# Patient Record
Sex: Male | Born: 1980 | Race: White | Hispanic: No | Marital: Married | Smoking: Former smoker
Health system: Southern US, Community
[De-identification: ages and names within clinical notes are randomized; demographics above are authoritative.]

## PROBLEM LIST (undated history)

## (undated) DIAGNOSIS — R112 Nausea with vomiting, unspecified: Secondary | ICD-10-CM

## (undated) DIAGNOSIS — K429 Umbilical hernia without obstruction or gangrene: Secondary | ICD-10-CM

## (undated) DIAGNOSIS — Z8619 Personal history of other infectious and parasitic diseases: Secondary | ICD-10-CM

## (undated) DIAGNOSIS — F419 Anxiety disorder, unspecified: Secondary | ICD-10-CM

## (undated) DIAGNOSIS — T8859XA Other complications of anesthesia, initial encounter: Secondary | ICD-10-CM

## (undated) DIAGNOSIS — M1990 Unspecified osteoarthritis, unspecified site: Secondary | ICD-10-CM

## (undated) DIAGNOSIS — Z9889 Other specified postprocedural states: Secondary | ICD-10-CM

## (undated) DIAGNOSIS — M303 Mucocutaneous lymph node syndrome [Kawasaki]: Secondary | ICD-10-CM

## (undated) DIAGNOSIS — R55 Syncope and collapse: Secondary | ICD-10-CM

## (undated) HISTORY — DX: Anxiety disorder, unspecified: F41.9

## (undated) HISTORY — PX: CYST REMOVAL HAND: SHX6279

## (undated) HISTORY — DX: Mucocutaneous lymph node syndrome (kawasaki): M30.3

## (undated) HISTORY — PX: HERNIA REPAIR: SHX51

## (undated) HISTORY — DX: Syncope and collapse: R55

## (undated) HISTORY — DX: Personal history of other infectious and parasitic diseases: Z86.19

---
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
## 2006-04-07 ENCOUNTER — Emergency Department: Payer: Self-pay | Admitting: Emergency Medicine

## 2009-11-09 ENCOUNTER — Emergency Department: Payer: Self-pay | Admitting: Emergency Medicine

## 2010-09-12 ENCOUNTER — Emergency Department: Payer: Self-pay | Admitting: Unknown Physician Specialty

## 2011-03-15 ENCOUNTER — Emergency Department: Payer: Self-pay | Admitting: Emergency Medicine

## 2011-04-02 ENCOUNTER — Ambulatory Visit: Payer: Self-pay | Admitting: Anesthesiology

## 2011-05-08 ENCOUNTER — Ambulatory Visit: Payer: Self-pay | Admitting: Anesthesiology

## 2011-09-12 ENCOUNTER — Emergency Department: Payer: Self-pay | Admitting: Emergency Medicine

## 2011-09-24 ENCOUNTER — Ambulatory Visit: Payer: Self-pay | Admitting: Family Medicine

## 2011-11-15 ENCOUNTER — Observation Stay: Payer: Self-pay | Admitting: Surgery

## 2012-04-26 HISTORY — PX: OTHER SURGICAL HISTORY: SHX169

## 2014-01-22 ENCOUNTER — Emergency Department: Payer: Self-pay | Admitting: Emergency Medicine

## 2014-07-15 ENCOUNTER — Encounter: Payer: Self-pay | Admitting: General Surgery

## 2014-07-16 ENCOUNTER — Ambulatory Visit (INDEPENDENT_AMBULATORY_CARE_PROVIDER_SITE_OTHER): Payer: BLUE CROSS/BLUE SHIELD | Admitting: General Surgery

## 2014-07-16 ENCOUNTER — Encounter: Payer: Self-pay | Admitting: General Surgery

## 2014-07-16 VITALS — BP 118/80 | HR 70 | Resp 12 | Ht 69.0 in | Wt 140.0 lb

## 2014-07-16 DIAGNOSIS — R2230 Localized swelling, mass and lump, unspecified upper limb: Secondary | ICD-10-CM | POA: Insufficient documentation

## 2014-07-16 DIAGNOSIS — R2232 Localized swelling, mass and lump, left upper limb: Secondary | ICD-10-CM | POA: Diagnosis not present

## 2014-07-16 NOTE — Progress Notes (Signed)
Patient ID: Paul Contreras, male   DOB: 13-Aug-1980, 34 y.o.   MRN: [id number]  Chief Complaint  Patient presents with  . Other    right forearm mass    HPI Paul Contreras is a 34 y.o. male here today for a evaluation of a right forearm mass. He states it has been there for about a year. It does seem to have gotten larger. It started out about the size of a mosquito bite. Denies pain. The patient is accompanied today by his wife, Paul Contreras. They celebrates their eighth anniversary tomorrow.   HPI  Past Medical History  Diagnosis Date  . 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 Anxiety     Past Surgical History  Procedure Laterality Date  . Cyst removal hand Left     arm   . Wrist surgery Right   . Dog bite  2014    right arm    No family history on file.  Social History History  Substance Use Topics  . Smoking status: Former Smoker -- 1.00 packs/day for 10 years    Types: Cigarettes  . Smokeless tobacco: Never Used  . Alcohol Use: 0.0 oz/week    0 Standard drinks or equivalent per week    No Known Allergies  Current Outpatient Prescriptions  Medication Sig Dispense Refill  . clonazePAM (KLONOPIN) 1 MG tablet Take 1 mg by mouth 2 (two) times daily.  2   No current facility-administered medications for this visit.    Review of Systems Review of Systems  Constitutional: Negative.   Respiratory: Negative.   Cardiovascular: Negative.     Blood pressure 118/80, pulse 70, resp. rate 12, height 5\' 9"  (1.753 m), weight 140 lb (63.504 kg).  Physical Exam Physical Exam  Constitutional: He is oriented to person, place, and time. He appears well-developed and well-nourished.  Cardiovascular: Normal rate, regular rhythm and normal heart sounds.   Pulmonary/Chest: Effort normal and breath sounds normal.  Musculoskeletal:       Arms: Lymphadenopathy:    He has no cervical adenopathy.    He has no axillary adenopathy.  Neurological: He is alert and oriented to person,  place, and time.  Skin: Skin is warm and dry.       Data Reviewed PCP notes of 07/03/2014.  Assessment    Dermal cyst, possibly related to 2014 injury.    Plan    The area will benefit from excision. The procedure was reviewed with the patient and his wife who was present for the interview and exam. The area was prepped with alcohol and 10 mL of 0.5% Xylocaine with 0.25% Marcaine with 1-200,000 units of epinephrine was utilized well tolerated. ChloraPrep was applied to the skin. The area was excised through elliptical incision. Thick white fluid was noted within the lesion. The cyst wall was excised completely. Hemostasis was with a 3-0 Vicryl figure-of-eight suture. The adipose layer was approximated with a simple 3-0 Vicryl suture. The skin was closed with 4-0 nylon in a running fashion. Telfa and Tegaderm dressing applied. Ice pack provided.  Wound care reviewed.    Patient to return in one week nurse       PCP:  Audry RilesFisher, Donald E Katherina Wimer W 07/16/2014, 8:52 PM

## 2014-07-16 NOTE — Patient Instructions (Signed)
Patient to return in one week nurse  

## 2014-07-24 ENCOUNTER — Telehealth: Payer: Self-pay | Admitting: *Deleted

## 2014-07-24 ENCOUNTER — Ambulatory Visit: Payer: BLUE CROSS/BLUE SHIELD

## 2014-07-24 NOTE — Telephone Encounter (Signed)
Patient is aware of his path.

## 2014-07-24 NOTE — Telephone Encounter (Signed)
Called patient today because he missed his nurse check up for suture removal. Patient states he removed them today himself and the area is clean and no sign of infection. He stated he could not get off work.

## 2014-08-13 NOTE — Op Note (Signed)
PATIENT NAME:  Paul Contreras, Paul Contreras MR#:  [id number] DATE OF BIRTH:  17-Nov-1980  DATE OF PROCEDURE:  11/14/2011  PREOPERATIVE DIAGNOSIS: Dog bite right forearm.   POSTOPERATIVE DIAGNOSIS: Dog bite right forearm.   PROCEDURE: Wound exploration, irrigation and closure.    SURGEON: Quentin Orealph L. Ely III, MD  ANESTHESIA: General.    DESCRIPTION OF PROCEDURE: With the patient in supine position after induction of appropriate general anesthesia patient'Contreras right arm was prepped with ChloraPrep and draped with sterile towels. The area was copiously irrigated with warm saline solution with GU irrigant. There were several small bleeding sites, all of which appear to be superficial which were cauterized. Did not appear to be any significant tendon injury although there were some fascial deficits and tendon sheath deficits. These areas were all closed with 3-0 Vicryl. A Penrose drain was inserted to connect the two wounds as the lateral wound and the dorsal wound were clearly part of one injury. The drain was placed through both lumens and then the skin edges stapled. The drain was secured with 3-0 nylon. Several small lesions were also stapled. Did appear to be some ischemic change in the skin flap as the vascular supply appeared compromised from the proximal aspect. There was a small skin bridge which is responsible at the present time for the patient'Contreras blood flow. 30 mL of 0.25% Marcaine were used to provide postoperative analgesia avoiding the skin bridge. Sterile dressings were applied. The patient returned to the recovery room have tolerated procedure well. Sponge, instrument, and needle counts were correct x2 in the Operating Room.   ____________________________ Carmie Endalph L. Ely III, MD rle:cms D: 11/15/2011 00:13:00 ET T: 11/15/2011 08:12:51 ET JOB#: [id number] cc: Carmie Endalph L. Ely III, MD, <Dictator> Demetrios Isaacsonald E. Sherrie MustacheFisher, MD Quentin OreALPH L ELY MD ELECTRONICALLY SIGNED 11/16/2011 22:43

## 2014-08-13 NOTE — Discharge Summary (Signed)
PATIENT NAME:  Paul Contreras, Paul Contreras MR#:  [id number] DATE OF BIRTH:  06Effie Shy/01/1981  DATE OF ADMISSION:  11/15/2011 DATE OF DISCHARGE:  11/15/2011  BRIEF HISTORY: Mable ParisDaniel Mendolia is a 34 year old gentleman seen in the Emergency Room having suffered a dog bite to the right forearm. The injury was suffered approximately four hours prior to admission. X-ray was unremarkable with the exception of the obvious soft tissue loss. The patient was taken urgently to surgery where he underwent debridement, irrigation, and closure of the wound. There did not appear to be any evidence of significant muscle or tendon injury. Drain was placed. The wound was wrapped. He is discharged home today to be followed in the office in 2 to 3 days' time.   DISCHARGE MEDICATIONS: He is to continue his home medications of: 1. Klonopin 1 mg p.o. b.i.d.  2. Suboxone 8 mg/2 mg 2 tablets once a day.  3. Keflex 500 mg p.o. q.8 hours p.r.n. for 10 days.   FINAL DISCHARGE DIAGNOSIS: Soft tissue and skin injury right arm secondary to dog bite.   SURGERY: Wound exploration, irrigation, and closure.   ____________________________ Carmie Endalph L. Ely III, MD rle:drc D: 11/15/2011 06:44:22 ET T: 11/15/2011 14:37:50 ET JOB#: [id number]  cc: Quentin Orealph L. Ely III, MD, <Dictator> Quentin OreALPH L ELY MD ELECTRONICALLY SIGNED 11/16/2011 22:43

## 2014-08-13 NOTE — H&P (Signed)
PATIENT NAME:  Paul Contreras, Paul Contreras MR#:  [id number] DATE OF BIRTH:  08-30-1980  DATE OF ADMISSION:  11/15/2011  PRIMARY CARE PHYSICIAN: Dr. Mila Merry ADMITTING PHYSICIAN: Dr. Michela Pitcher.  CHIEF COMPLAINT: Dog bite right forearm.   BRIEF HISTORY: Goro Wenrick is a 34 year old gentleman seen in the Emergency Room with a significant injury to his right forearm. He suffered a dog bite to the volar surface of his arm approximately four hours prior to admission. He presented to the Emergency Room, bleeding was controlled with manual palpation and pressure. Wound exploration was tentatively carried out. There did not appear to be any significant vascular injury other than venous problems. He did not have any obvious neurovascular deficits in the hand. However, because of the extensive soft tissue damage the surgical service was consulted.   Patient denies any other serious medical problems. He does not have any history of cardiac disease, hypertension, or diabetes. Does have a history of substance abuse and is taking medication to place the methadone that he has been on in the past. He has a history of a fracture in the right forearm with a pin in his wrist. He has had previous cyst removed from his right chest and he has had bilateral inguinal hernias repaired as a baby.   CURRENT MEDICATIONS:  1. Klonopin 1 mg p.o. b.i.d.  2. Suboxone 8/2 sublingual 2 tabs twice a day. 3. Ultram 50 mg p.o. q.4 hours p.r.n.   ALLERGIES: He is not allergic to any medication.   SOCIAL HISTORY: He was a cigarette smoker until approximately 4 to 5 months ago. He does not drink any alcohol. He works as an Personnel officer.   FAMILY HISTORY: Noncontributory.   REVIEW OF SYSTEMS: Review of systems is not related to the current medical problem. He denies any other medical complaints at the present time other than his history of substance abuse.   PHYSICAL EXAMINATION:  GENERAL: He is an anxious, uncomfortable young gentleman in no  significant distress.   VITALS: Blood pressure 126/70, heart rate 88 and regular, respirations 22, pain scale 10, temperature 96.   HEENT: Unremarkable. He has no scleral icterus and no facial deformities.   NECK: Supple without adenopathy. His trachea is midline.   CHEST: Clear bilaterally with no adventitious sounds and has normal pulmonary excursion.   CARDIAC: No murmurs or gallops. He seems to be in normal sinus rhythm.    ABDOMEN: His abdomen is benign with no masses, hernias, guarding or rebound.   EXTREMITIES: Lower extremity exam is unremarkable with no deformities. Normal range of motion. Upper extremity exam reveals multiple puncture wounds and lacerations to the volar surface of the right forearm. He does appear to have some skin retraction, some obvious skin bleeding, exposed tendon. He has good distal pulses. No vascular insufficiency and no obvious neurovascular compromise. The arm was rewrapped after examination.   NEUROLOGIC: Exam reveals anxious affect with normal orientation.   IMPRESSION: This gentleman has significant forearm injury from a dog bite. Does not appear to be treatable in an Emergency Room local anesthetic setting. We will move him to the Operating Room and perform wound exploration and closure. The risks of infection and possible need for eventual skin graft have been discussed with the patient and his family. They are in agreement.    ____________________________ Carmie End, MD rle:cms D: 11/14/2011 22:38:20 ET T: 11/15/2011 07:12:08 ET JOB#: [id number] cc: Carmie End, MD, <Dictator> Demetrios Isaacs. Sherrie Mustache, MD Quentin Ore MD  ELECTRONICALLY SIGNED 11/16/2011 22:43

## 2014-09-29 ENCOUNTER — Emergency Department: Payer: BLUE CROSS/BLUE SHIELD

## 2014-09-29 ENCOUNTER — Emergency Department
Admission: EM | Admit: 2014-09-29 | Discharge: 2014-09-29 | Disposition: A | Discharge status: Home / Self Care | Payer: BLUE CROSS/BLUE SHIELD | Attending: Emergency Medicine | Admitting: Emergency Medicine

## 2014-09-29 ENCOUNTER — Encounter: Payer: Self-pay | Admitting: Emergency Medicine

## 2014-09-29 DIAGNOSIS — Z87891 Personal history of nicotine dependence: Secondary | ICD-10-CM | POA: Diagnosis not present

## 2014-09-29 DIAGNOSIS — Z792 Long term (current) use of antibiotics: Secondary | ICD-10-CM | POA: Insufficient documentation

## 2014-09-29 DIAGNOSIS — Y9389 Activity, other specified: Secondary | ICD-10-CM | POA: Diagnosis not present

## 2014-09-29 DIAGNOSIS — X58XXXA Exposure to other specified factors, initial encounter: Secondary | ICD-10-CM | POA: Insufficient documentation

## 2014-09-29 DIAGNOSIS — S6991XA Unspecified injury of right wrist, hand and finger(s), initial encounter: Secondary | ICD-10-CM | POA: Diagnosis present

## 2014-09-29 DIAGNOSIS — S61431A Puncture wound without foreign body of right hand, initial encounter: Secondary | ICD-10-CM | POA: Insufficient documentation

## 2014-09-29 DIAGNOSIS — L089 Local infection of the skin and subcutaneous tissue, unspecified: Secondary | ICD-10-CM | POA: Insufficient documentation

## 2014-09-29 DIAGNOSIS — T798XXA Other early complications of trauma, initial encounter: Secondary | ICD-10-CM

## 2014-09-29 DIAGNOSIS — Y92410 Unspecified street and highway as the place of occurrence of the external cause: Secondary | ICD-10-CM | POA: Insufficient documentation

## 2014-09-29 DIAGNOSIS — Y998 Other external cause status: Secondary | ICD-10-CM | POA: Diagnosis not present

## 2014-09-29 DIAGNOSIS — S60221A Contusion of right hand, initial encounter: Secondary | ICD-10-CM | POA: Diagnosis not present

## 2014-09-29 MED ORDER — CEPHALEXIN 500 MG PO CAPS: 500.0000 mg | ORAL_CAPSULE | Freq: Four times a day (QID) | ORAL | Status: AC

## 2014-09-29 MED ORDER — IBUPROFEN 800 MG PO TABS: 800.0000 mg | ORAL_TABLET | Freq: Three times a day (TID) | ORAL | Status: DC | PRN

## 2014-09-29 MED ORDER — HYDROCODONE-ACETAMINOPHEN 5-325 MG PO TABS: 1.0000 | ORAL_TABLET | ORAL | Status: DC | PRN
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
## 2014-09-29 NOTE — Discharge Instructions (Signed)
Hand Contusion A hand contusion is a deep bruise on your hand area. Contusions are the result of an injury that caused bleeding under the skin. The contusion may turn blue, purple, or yellow. Minor injuries will give you a painless contusion, but more severe contusions may stay painful and swollen for a few weeks. CAUSES  A contusion is usually caused by a blow, trauma, or direct force to an area of the body. SYMPTOMS   Swelling and redness of the injured area.  Discoloration of the injured area.  Tenderness and soreness of the injured area.  Pain. DIAGNOSIS  The diagnosis can be made by taking a history and performing a physical exam. An X-ray, CT scan, or MRI may be needed to determine if there were any associated injuries, such as broken bones (fractures). TREATMENT  Often, the best treatment for a hand contusion is resting, elevating, icing, and applying cold compresses to the injured area. Over-the-counter medicines may also be recommended for pain control. HOME CARE INSTRUCTIONS   Put ice on the injured area.  Put ice in a plastic bag.  Place a towel between your skin and the bag.  Leave the ice on for 15-20 minutes, 03-04 times a day.  Only take over-the-counter or prescription medicines as directed by your caregiver. Your caregiver may recommend avoiding anti-inflammatory medicines (aspirin, ibuprofen, and naproxen) for 48 hours because these medicines may increase bruising.  If told, use an elastic wrap as directed. This can help reduce swelling. You may remove the wrap for sleeping, showering, and bathing. If your fingers become numb, cold, or blue, take the wrap off and reapply it more loosely.  Elevate your hand with pillows to reduce swelling.  Avoid overusing your hand if it is painful. SEEK IMMEDIATE MEDICAL CARE IF:   You have increased redness, swelling, or pain in your hand.  Your swelling or pain is not relieved with medicines.  You have loss of feeling in  your hand or are unable to move your fingers.  Your hand turns cold or blue.  You have pain when you move your fingers.  Your hand becomes warm to the touch.  Your contusion does not improve in 2 days. MAKE SURE YOU:   Understand these instructions.  Will watch your condition.  Will get help right away if you are not doing well or get worse. Document Released: 10/02/2001 Document Revised: 01/05/2012 Document Reviewed: 10/04/2011 [facility] Patient Information 2015 [employer] [place]. This information is not intended to replace advice given to you by your health care provider. Make sure you discuss any questions you have with your health care provider.  Wound Infection A wound infection happens when a type of germ (bacteria) starts growing in the wound. In some cases, this can cause the wound to break open. If cared for properly, the infected wound will heal from the inside to the outside. Wound infections need treatment. CAUSES An infection is caused by bacteria growing in the wound.  SYMPTOMS   Increase in redness, swelling, or pain at the wound site.  Increase in drainage at the wound site.  Wound or bandage (dressing) starts to smell bad.  Fever.  Feeling tired or fatigued.  Pus draining from the wound. TREATMENT  Your health care provider will prescribe antibiotic medicine. The wound infection should improve within 24 to 48 hours. Any redness around the wound should stop spreading and the wound should be less painful.  HOME CARE INSTRUCTIONS   Only take over-the-counter or prescription medicines  for pain, discomfort, or fever as directed by your health care provider.  Take your antibiotics as directed. Finish them even if you start to feel better.  Gently wash the area with mild soap and water 2 times a day, or as directed. Rinse off the soap. Pat the area dry with a clean towel. Do not rub the wound. This may cause bleeding.  Follow your health care provider's  instructions for how often you need to change the dressing.  Apply ointment and a dressing to the wound as directed.  If the dressing sticks, moisten it with soapy water and gently remove it.  Change the bandage right away if it becomes wet, dirty, or develops a bad smell.  Take showers. Do not take tub baths, swim, or do anything that may soak the wound until it is healed.  Avoid exercises that make you sweat heavily.  Use anti-itch medicine as directed by your health care provider. The wound may itch when it is healing. Do not pick or scratch at the wound.  Follow up with your health care provider to get your wound rechecked as directed. SEEK MEDICAL CARE IF:  You have an increase in swelling, pain, or redness around the wound.  You have an increase in the amount of pus coming from the wound.  There is a bad smell coming from the wound.  More of the wound breaks open.  You have a fever. MAKE SURE YOU:   Understand these instructions.  Will watch your condition.  Will get help right away if you are not doing well or get worse. Document Released: 01/09/2003 Document Revised: 04/17/2013 Document Reviewed: 08/16/2010 [facility] Patient Information 2015 [place], [place]. This information is not intended to replace advice given to you by your health care provider. Make sure you discuss any questions you have with your health care provider.
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
## 2014-11-15 ENCOUNTER — Encounter: Payer: Self-pay | Admitting: Family Medicine

## 2014-11-15 ENCOUNTER — Ambulatory Visit (INDEPENDENT_AMBULATORY_CARE_PROVIDER_SITE_OTHER): Payer: BLUE CROSS/BLUE SHIELD | Admitting: Family Medicine

## 2014-11-15 VITALS — BP 116/74 | HR 86 | Temp 97.6°F | Resp 16 | Wt 138.0 lb

## 2014-11-15 DIAGNOSIS — R55 Syncope and collapse: Secondary | ICD-10-CM

## 2014-11-15 DIAGNOSIS — L509 Urticaria, unspecified: Secondary | ICD-10-CM

## 2014-11-15 DIAGNOSIS — M7021 Olecranon bursitis, right elbow: Secondary | ICD-10-CM | POA: Insufficient documentation

## 2014-11-15 DIAGNOSIS — S62509A Fracture of unspecified phalanx of unspecified thumb, initial encounter for closed fracture: Secondary | ICD-10-CM | POA: Insufficient documentation

## 2014-11-15 DIAGNOSIS — R2231 Localized swelling, mass and lump, right upper limb: Secondary | ICD-10-CM | POA: Insufficient documentation

## 2014-11-15 DIAGNOSIS — M303 Mucocutaneous lymph node syndrome [Kawasaki]: Secondary | ICD-10-CM | POA: Insufficient documentation

## 2014-11-15 HISTORY — DX: Syncope and collapse: R55

## 2014-11-15 MED ORDER — RANITIDINE HCL 150 MG PO TABS: 150.0000 mg | ORAL_TABLET | Freq: Two times a day (BID) | ORAL | Status: DC

## 2014-11-15 NOTE — Progress Notes (Signed)
Patient: Paul Contreras Male    DOB: 1980/09/19   34 y.o.   MRN: [id number] Visit Date: 11/15/2014  Today's Provider: Mila Merry, MD   Chief Complaint  Patient presents with  . Rash   Subjective:    Rash This is a recurrent problem. The current episode started more than 1 year ago. The problem is unchanged. The rash is characterized by itchiness, redness and swelling. Associated symptoms include shortness of breath. Past treatments include topical steroids. The treatment provided no relief.     Rash appears at random, has happened on several different occurences in the last year. Rash usually lasts hours to a day or two. Had been occuring every week or two, but over the last month has been having at increasing frequencies, sometimes several days in a row. Is very itchy, usually clears up with benadryl, although episode did not respond well to benadryl. Has mostly resolved today, but has some scratches on feet. Does not seem related to heat or environment. No specific triggers. No new medications. No new foods. No new pets.   He brings pictures on his cell phone showing extensive raised erythema on his back, arms and legs.     No Known Allergies Previous Medications   CLONAZEPAM (KLONOPIN) 1 MG TABLET    Take 1 mg by mouth 2 (two) times daily.   HYDROCODONE-ACETAMINOPHEN (NORCO) 5-325 MG PER TABLET    Take 1-2 tablets by mouth every 4 (four) hours as needed for moderate pain.   IBUPROFEN (ADVIL,MOTRIN) 800 MG TABLET    Take 1 tablet (800 mg total) by mouth every 8 (eight) hours as needed.    Review of Systems  Respiratory: Positive for shortness of breath.   Cardiovascular: Negative for chest pain and palpitations.  Musculoskeletal: Positive for back pain.  Skin: Positive for rash.  Neurological: Positive for dizziness and light-headedness. Negative for headaches.    History  Substance Use Topics  . Smoking status: Former Smoker -- 1.00 packs/day for 15 years   Types: Cigarettes  . Smokeless tobacco: Never Used  . Alcohol Use: 0.0 oz/week    0 Standard drinks or equivalent per week     Comment: occasional use; heavy drinker in the past. Now drinks socially. 2 drinks every other week   Objective:   BP 116/74 mmHg  Pulse 86  Temp(Src) 97.6 F (36.4 C) (Oral)  Resp 16  Wt 138 lb (62.596 kg)  SpO2 96%  Physical Exam  General appearance: alert, well developed, well nourished, cooperative and in no distress Head: Normocephalic, without obvious abnormality, atraumatic Lungs: Respirations even and unlabored Extremities: No gross deformities Skin: Skin color, texture, turgor normal. No rashes seen  Psych: Appropriate mood and affect. Neurologic: Mental status: Alert, oriented to person, place, and time, thought content appropriate.     Assessment & Plan:      1. Urticaria Ongoing and increasing in frequency over the last year. May be psychiatric or anxiety related. Possible allergic. He and his girlfriend are very frustrated by this and would like more definitive evaluation. Will start with H1 & H2 blockers and referral to allergist. Advised he will need to be off of antihistamines if skin testing is done.   - Ambulatory referral to Allergy - ranitidine (ZANTAC) 150 MG tablet; Take 1 tablet (150 mg total) by mouth 2 (two) times daily.  Dispense: 60 tablet; Refill: 1        Mila Merry, MD  [facility]  [place]

## 2014-11-15 NOTE — Patient Instructions (Signed)
Start OTC Zyrtec (cetirizine)  once a day.

## 2016-04-30 ENCOUNTER — Encounter: Payer: Self-pay | Admitting: Emergency Medicine

## 2016-04-30 ENCOUNTER — Emergency Department
Admission: EM | Admit: 2016-04-30 | Discharge: 2016-04-30 | Disposition: A | Discharge status: Home / Self Care | Payer: No Typology Code available for payment source | Attending: Emergency Medicine | Admitting: Emergency Medicine

## 2016-04-30 ENCOUNTER — Emergency Department: Payer: No Typology Code available for payment source

## 2016-04-30 DIAGNOSIS — Y999 Unspecified external cause status: Secondary | ICD-10-CM | POA: Insufficient documentation

## 2016-04-30 DIAGNOSIS — Z87891 Personal history of nicotine dependence: Secondary | ICD-10-CM | POA: Diagnosis not present

## 2016-04-30 DIAGNOSIS — Y9389 Activity, other specified: Secondary | ICD-10-CM | POA: Insufficient documentation

## 2016-04-30 DIAGNOSIS — S0990XA Unspecified injury of head, initial encounter: Secondary | ICD-10-CM | POA: Diagnosis present

## 2016-04-30 DIAGNOSIS — S6392XA Sprain of unspecified part of left wrist and hand, initial encounter: Secondary | ICD-10-CM | POA: Insufficient documentation

## 2016-04-30 DIAGNOSIS — S161XXA Strain of muscle, fascia and tendon at neck level, initial encounter: Secondary | ICD-10-CM

## 2016-04-30 DIAGNOSIS — S0003XA Contusion of scalp, initial encounter: Secondary | ICD-10-CM | POA: Diagnosis not present

## 2016-04-30 DIAGNOSIS — S63502A Unspecified sprain of left wrist, initial encounter: Secondary | ICD-10-CM

## 2016-04-30 DIAGNOSIS — Y92828 Other wilderness area as the place of occurrence of the external cause: Secondary | ICD-10-CM | POA: Insufficient documentation

## 2016-04-30 MED ORDER — HYDROCODONE-ACETAMINOPHEN 5-325 MG PO TABS
2.0000 | ORAL_TABLET | Freq: Once | ORAL | Status: AC
  Administered 2016-04-30: 2 via ORAL
  Filled 2016-04-30: qty 2

## 2016-04-30 MED ORDER — HYDROCODONE-ACETAMINOPHEN 5-325 MG PO TABS: 1.0000 | ORAL_TABLET | ORAL | 0 refills | Status: DC | PRN

## 2016-04-30 NOTE — Discharge Instructions (Signed)
Wear wrist  splint to protect your left wrist. Ice and elevate to reduce swelling and pain. Norco as needed for stool pain. Follow-up with your primary care doctor if any continued problems. Clean abrasion with mild soap and water daily. Watch for signs of infection.
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
## 2016-07-29 ENCOUNTER — Emergency Department
Admission: EM | Admit: 2016-07-29 | Discharge: 2016-07-29 | Disposition: A | Discharge status: Home / Self Care | Payer: Self-pay | Attending: Emergency Medicine | Admitting: Emergency Medicine

## 2016-07-29 ENCOUNTER — Emergency Department: Payer: Self-pay

## 2016-07-29 ENCOUNTER — Encounter: Payer: Self-pay | Admitting: Emergency Medicine

## 2016-07-29 DIAGNOSIS — Y999 Unspecified external cause status: Secondary | ICD-10-CM | POA: Insufficient documentation

## 2016-07-29 DIAGNOSIS — Y9389 Activity, other specified: Secondary | ICD-10-CM | POA: Insufficient documentation

## 2016-07-29 DIAGNOSIS — S80811A Abrasion, right lower leg, initial encounter: Secondary | ICD-10-CM

## 2016-07-29 DIAGNOSIS — Y929 Unspecified place or not applicable: Secondary | ICD-10-CM | POA: Insufficient documentation

## 2016-07-29 DIAGNOSIS — S8781XA Crushing injury of right lower leg, initial encounter: Secondary | ICD-10-CM | POA: Insufficient documentation

## 2016-07-29 DIAGNOSIS — W228XXA Striking against or struck by other objects, initial encounter: Secondary | ICD-10-CM | POA: Insufficient documentation

## 2016-07-29 DIAGNOSIS — Z87891 Personal history of nicotine dependence: Secondary | ICD-10-CM | POA: Insufficient documentation

## 2016-07-29 MED ORDER — OXYCODONE-ACETAMINOPHEN 5-325 MG PO TABS: 1.0000 | ORAL_TABLET | ORAL | 0 refills | Status: DC | PRN

## 2016-07-29 MED ORDER — CEPHALEXIN 500 MG PO CAPS: 500.0000 mg | ORAL_CAPSULE | Freq: Three times a day (TID) | ORAL | 0 refills | Status: DC

## 2016-07-29 MED ORDER — IBUPROFEN 600 MG PO TABS: 600.0000 mg | ORAL_TABLET | Freq: Three times a day (TID) | ORAL | 0 refills | Status: DC | PRN

## 2016-07-29 MED ORDER — OXYCODONE-ACETAMINOPHEN 5-325 MG PO TABS
2.0000 | ORAL_TABLET | Freq: Once | ORAL | Status: AC
  Administered 2016-07-29: 2 via ORAL
  Filled 2016-07-29: qty 2

## 2016-07-29 NOTE — Discharge Instructions (Signed)
Ice and elevate to reduce swelling and help with pain. Use crutches for walking. Call and make an appointment with your primary care doctor for follow-up. Ibuprofen 600 mg 3 times a day with food. Percocet one every 4 hours as needed for pain. Keflex 500 mg 3 times a day for 7 days. Wash right leg with mild soap and water twice a day. Watch for signs of infection.   Return to the emergency room if any signs of infection, drainage, fever, chills or severe worsening of your symptoms.
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
## 2019-10-07 ENCOUNTER — Other Ambulatory Visit: Payer: Self-pay

## 2019-10-07 ENCOUNTER — Emergency Department: Payer: Self-pay

## 2019-10-07 ENCOUNTER — Emergency Department
Admission: EM | Admit: 2019-10-07 | Discharge: 2019-10-07 | Disposition: A | Discharge status: Home / Self Care | Payer: Self-pay | Attending: Emergency Medicine | Admitting: Emergency Medicine

## 2019-10-07 ENCOUNTER — Encounter: Payer: Self-pay | Admitting: Intensive Care

## 2019-10-07 DIAGNOSIS — Y999 Unspecified external cause status: Secondary | ICD-10-CM | POA: Insufficient documentation

## 2019-10-07 DIAGNOSIS — S62639B Displaced fracture of distal phalanx of unspecified finger, initial encounter for open fracture: Secondary | ICD-10-CM

## 2019-10-07 DIAGNOSIS — S62631B Displaced fracture of distal phalanx of left index finger, initial encounter for open fracture: Secondary | ICD-10-CM | POA: Insufficient documentation

## 2019-10-07 DIAGNOSIS — Z23 Encounter for immunization: Secondary | ICD-10-CM | POA: Insufficient documentation

## 2019-10-07 DIAGNOSIS — Y9289 Other specified places as the place of occurrence of the external cause: Secondary | ICD-10-CM | POA: Insufficient documentation

## 2019-10-07 DIAGNOSIS — Z87891 Personal history of nicotine dependence: Secondary | ICD-10-CM | POA: Insufficient documentation

## 2019-10-07 DIAGNOSIS — Z20822 Contact with and (suspected) exposure to covid-19: Secondary | ICD-10-CM | POA: Insufficient documentation

## 2019-10-07 DIAGNOSIS — R600 Localized edema: Secondary | ICD-10-CM | POA: Insufficient documentation

## 2019-10-07 DIAGNOSIS — Y9389 Activity, other specified: Secondary | ICD-10-CM | POA: Insufficient documentation

## 2019-10-07 DIAGNOSIS — W3400XA Accidental discharge from unspecified firearms or gun, initial encounter: Secondary | ICD-10-CM | POA: Insufficient documentation

## 2019-10-07 LAB — CBC WITH DIFFERENTIAL/PLATELET
Abs Immature Granulocytes: 0.04 10*3/uL (ref 0.00–0.07)
Basophils Absolute: 0.1 10*3/uL (ref 0.0–0.1)
Basophils Relative: 1 %
Eosinophils Absolute: 0 10*3/uL (ref 0.0–0.5)
Eosinophils Relative: 0 %
HCT: 40.8 % (ref 39.0–52.0)
Hemoglobin: 13.9 g/dL (ref 13.0–17.0)
Immature Granulocytes: 1 %
Lymphocytes Relative: 10 %
Lymphs Abs: 0.8 10*3/uL (ref 0.7–4.0)
MCH: 31.2 pg (ref 26.0–34.0)
MCHC: 34.1 g/dL (ref 30.0–36.0)
MCV: 91.5 fL (ref 80.0–100.0)
Monocytes Absolute: 0.6 10*3/uL (ref 0.1–1.0)
Monocytes Relative: 8 %
Neutro Abs: 6.1 10*3/uL (ref 1.7–7.7)
Neutrophils Relative %: 80 %
Platelets: 305 10*3/uL (ref 150–400)
RBC: 4.46 MIL/uL (ref 4.22–5.81)
RDW: 11.9 % (ref 11.5–15.5)
WBC: 7.6 10*3/uL (ref 4.0–10.5)
nRBC: 0 % (ref 0.0–0.2)

## 2019-10-07 LAB — COMPREHENSIVE METABOLIC PANEL
ALT: 17 U/L (ref 0–44)
AST: 25 U/L (ref 15–41)
Albumin: 4.4 g/dL (ref 3.5–5.0)
Alkaline Phosphatase: 47 U/L (ref 38–126)
Anion gap: 9 (ref 5–15)
BUN: 13 mg/dL (ref 6–20)
CO2: 26 mmol/L (ref 22–32)
Calcium: 8.8 mg/dL — ABNORMAL LOW (ref 8.9–10.3)
Chloride: 102 mmol/L (ref 98–111)
Creatinine, Ser: 0.7 mg/dL (ref 0.61–1.24)
GFR calc Af Amer: >60 mL/min (ref >60)
GFR calc non Af Amer: >60 mL/min (ref >60)
Glucose, Bld: 95 mg/dL (ref 70–99)
Potassium: 3.8 mmol/L (ref 3.5–5.1)
Sodium: 137 mmol/L (ref 135–145)
Total Bilirubin: 0.7 mg/dL (ref 0.3–1.2)
Total Protein: 7.2 g/dL (ref 6.5–8.1)

## 2019-10-07 LAB — SARS CORONAVIRUS 2 BY RT PCR (HOSPITAL ORDER, PERFORMED IN ~~LOC~~ HOSPITAL LAB): SARS Coronavirus 2: NEGATIVE

## 2019-10-07 LAB — APTT: aPTT: 26 seconds (ref 24–36)

## 2019-10-07 LAB — PROTIME-INR
INR: 0.9 (ref 0.8–1.2)
Prothrombin Time: 11.3 seconds — ABNORMAL LOW (ref 11.4–15.2)

## 2019-10-07 MED ORDER — OXYCODONE HCL 5 MG PO TABS: 5.0000 mg | ORAL_TABLET | Freq: Four times a day (QID) | ORAL | 0 refills | Status: AC | PRN

## 2019-10-07 MED ORDER — TETANUS-DIPHTH-ACELL PERTUSSIS 5-2.5-18.5 LF-MCG/0.5 IM SUSP
0.5000 mL | Freq: Once | INTRAMUSCULAR | Status: AC
  Administered 2019-10-07: 0.5 mL via INTRAMUSCULAR
  Filled 2019-10-07: qty 0.5

## 2019-10-07 MED ORDER — CEPHALEXIN 250 MG PO CAPS: 250.0000 mg | ORAL_CAPSULE | Freq: Four times a day (QID) | ORAL | 0 refills | Status: AC

## 2019-10-07 MED ORDER — OXYCODONE HCL 5 MG PO TABS
5.0000 mg | ORAL_TABLET | Freq: Once | ORAL | Status: AC
  Administered 2019-10-07: 5 mg via ORAL
  Filled 2019-10-07: qty 1

## 2019-10-07 MED ORDER — CEFAZOLIN SODIUM-DEXTROSE 1-4 GM/50ML-% IV SOLN
1.0000 g | Freq: Once | INTRAVENOUS | Status: AC
  Administered 2019-10-07: 1 g via INTRAVENOUS
  Filled 2019-10-07 (×2): qty 50

## 2019-10-07 MED ORDER — HYDROMORPHONE HCL 1 MG/ML IJ SOLN
0.5000 mg | Freq: Once | INTRAMUSCULAR | Status: AC
  Administered 2019-10-07: 0.5 mg via INTRAVENOUS
  Filled 2019-10-07: qty 1

## 2019-10-07 MED ORDER — ONDANSETRON HCL 4 MG/2ML IJ SOLN
4.0000 mg | Freq: Once | INTRAMUSCULAR | Status: AC
  Administered 2019-10-07: 4 mg via INTRAVENOUS
  Filled 2019-10-07: qty 2
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
## 2019-11-25 ENCOUNTER — Encounter: Payer: Self-pay | Admitting: Emergency Medicine

## 2019-11-25 ENCOUNTER — Other Ambulatory Visit: Payer: Self-pay

## 2019-11-25 ENCOUNTER — Emergency Department: Payer: Medicaid Other

## 2019-11-25 DIAGNOSIS — S91311A Laceration without foreign body, right foot, initial encounter: Secondary | ICD-10-CM | POA: Insufficient documentation

## 2019-11-25 DIAGNOSIS — W5652XA Struck by other fish, initial encounter: Secondary | ICD-10-CM | POA: Insufficient documentation

## 2019-11-25 DIAGNOSIS — Y9319 Activity, other involving water and watercraft: Secondary | ICD-10-CM | POA: Insufficient documentation

## 2019-11-25 DIAGNOSIS — Y999 Unspecified external cause status: Secondary | ICD-10-CM | POA: Insufficient documentation

## 2019-11-25 DIAGNOSIS — Z87891 Personal history of nicotine dependence: Secondary | ICD-10-CM | POA: Insufficient documentation

## 2019-11-25 DIAGNOSIS — Y92832 Beach as the place of occurrence of the external cause: Secondary | ICD-10-CM | POA: Insufficient documentation

## 2019-11-25 NOTE — ED Triage Notes (Signed)
Pt arrived [place] POV [place], [place] to [place] of foot.  Unk last tetanus

## 2019-11-26 ENCOUNTER — Emergency Department
Admission: EM | Admit: 2019-11-26 | Discharge: 2019-11-26 | Disposition: A | Discharge status: Home / Self Care | Payer: Medicaid Other | Attending: Emergency Medicine | Admitting: Emergency Medicine

## 2019-11-26 DIAGNOSIS — S91311A Laceration without foreign body, right foot, initial encounter: Secondary | ICD-10-CM

## 2019-11-26 MED ORDER — IBUPROFEN 800 MG PO TABS
800.0000 mg | ORAL_TABLET | ORAL | Status: AC
  Administered 2019-11-26: 800 mg via ORAL
  Filled 2019-11-26: qty 1

## 2019-11-26 MED ORDER — PENTAFLUOROPROP-TETRAFLUOROETH EX AERO
INHALATION_SPRAY | CUTANEOUS | Status: DC | PRN
  Administered 2019-11-26: 30 via TOPICAL
  Filled 2019-11-26 (×2): qty 30

## 2019-11-26 MED ORDER — HYDROCODONE-ACETAMINOPHEN 5-325 MG PO TABS
2.0000 | ORAL_TABLET | Freq: Once | ORAL | Status: AC
  Administered 2019-11-26: 2 via ORAL
  Filled 2019-11-26: qty 2

## 2019-11-26 MED ORDER — DOXYCYCLINE HYCLATE 100 MG PO CAPS: 100.0000 mg | ORAL_CAPSULE | Freq: Two times a day (BID) | ORAL | 0 refills | Status: DC

## 2019-11-26 MED ORDER — CIPROFLOXACIN HCL 500 MG PO TABS: 500.0000 mg | ORAL_TABLET | Freq: Two times a day (BID) | ORAL | 0 refills | Status: AC

## 2019-11-26 MED ORDER — DOXYCYCLINE HYCLATE 100 MG PO TABS
100.0000 mg | ORAL_TABLET | Freq: Once | ORAL | Status: AC
  Administered 2019-11-26: 100 mg via ORAL
  Filled 2019-11-26: qty 1

## 2019-11-26 MED ORDER — CIPROFLOXACIN HCL 500 MG PO TABS
500.0000 mg | ORAL_TABLET | Freq: Once | ORAL | Status: AC
  Administered 2019-11-26: 500 mg via ORAL
  Filled 2019-11-26: qty 1
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
## 2019-11-26 NOTE — Discharge Instructions (Addendum)
Please watch carefully for signs of infection.  Please return to the ER right away if you develop a fever, drainage of yellow or pus from the laceration, fever, numbness weakness or cold blue foot, bleeding, redness, or other new concerns or symptoms arise.  No driving this morning. Use crutches and keep weight off your right foot until you are able to follow-up with Dr. Alberteen Spindle this week for reassessment.  Please call today to set up a follow-up appointment this week with podiatrist Dr. Alberteen Spindle.  Use antibiotics as prescribed to prevent infection.

## 2020-07-14 ENCOUNTER — Ambulatory Visit (INDEPENDENT_AMBULATORY_CARE_PROVIDER_SITE_OTHER): Payer: BLUE CROSS/BLUE SHIELD | Admitting: Family Medicine

## 2020-07-14 DIAGNOSIS — Z5329 Procedure and treatment not carried out because of patient's decision for other reasons: Secondary | ICD-10-CM

## 2020-07-14 NOTE — Patient Instructions (Signed)
.   Please review the attached list of medications and notify my office if there are any errors.   . Please bring all of your medications to every appointment so we can make sure that our medication list is the same as yours.   

## 2020-07-14 NOTE — Progress Notes (Signed)
Patient did not show for appointment.   

## 2020-08-12 ENCOUNTER — Emergency Department: Payer: 59

## 2020-08-12 ENCOUNTER — Other Ambulatory Visit: Payer: Self-pay

## 2020-08-12 ENCOUNTER — Emergency Department
Admission: EM | Admit: 2020-08-12 | Discharge: 2020-08-12 | Disposition: A | Discharge status: Home / Self Care | Payer: 59 | Attending: Emergency Medicine | Admitting: Emergency Medicine

## 2020-08-12 DIAGNOSIS — Y990 Civilian activity done for income or pay: Secondary | ICD-10-CM | POA: Diagnosis not present

## 2020-08-12 DIAGNOSIS — S2239XA Fracture of one rib, unspecified side, initial encounter for closed fracture: Secondary | ICD-10-CM | POA: Insufficient documentation

## 2020-08-12 DIAGNOSIS — X58XXXA Exposure to other specified factors, initial encounter: Secondary | ICD-10-CM | POA: Diagnosis not present

## 2020-08-12 DIAGNOSIS — S299XXA Unspecified injury of thorax, initial encounter: Secondary | ICD-10-CM | POA: Diagnosis present

## 2020-08-12 DIAGNOSIS — Z87891 Personal history of nicotine dependence: Secondary | ICD-10-CM | POA: Insufficient documentation

## 2020-08-12 DIAGNOSIS — S2231XA Fracture of one rib, right side, initial encounter for closed fracture: Secondary | ICD-10-CM

## 2020-08-12 LAB — CBC
HCT: 42.7 % (ref 39.0–52.0)
Hemoglobin: 14.8 g/dL (ref 13.0–17.0)
MCH: 31.1 pg (ref 26.0–34.0)
MCHC: 34.7 g/dL (ref 30.0–36.0)
MCV: 89.7 fL (ref 80.0–100.0)
Platelets: 335 10*3/uL (ref 150–400)
RBC: 4.76 MIL/uL (ref 4.22–5.81)
RDW: 11.8 % (ref 11.5–15.5)
WBC: 7 10*3/uL (ref 4.0–10.5)
nRBC: 0 % (ref 0.0–0.2)

## 2020-08-12 LAB — BASIC METABOLIC PANEL
Anion gap: 7 (ref 5–15)
BUN: 12 mg/dL (ref 6–20)
CO2: 30 mmol/L (ref 22–32)
Calcium: 9.2 mg/dL (ref 8.9–10.3)
Chloride: 100 mmol/L (ref 98–111)
Creatinine, Ser: 0.76 mg/dL (ref 0.61–1.24)
GFR, Estimated: >60 mL/min (ref >60)
Glucose, Bld: 119 mg/dL — ABNORMAL HIGH (ref 70–99)
Potassium: 4.5 mmol/L (ref 3.5–5.1)
Sodium: 137 mmol/L (ref 135–145)

## 2020-08-12 LAB — TROPONIN I (HIGH SENSITIVITY): Troponin I (High Sensitivity): <2 ng/L (ref <18)

## 2020-08-12 NOTE — ED Provider Notes (Signed)
[facility] Emergency Department Provider Note  Time seen: 11:14 AM  I have reviewed the triage vital signs and the nursing notes.   HISTORY  Chief Complaint Chest Pain and Shortness of Breath   HPI Paul Contreras is a 40 y.o. male with a past medical history of anxiety, presents to the emergency department for right-sided chest pain.  According to the patient since Saturday he has been experiencing pain in the right chest.  States he was lying down in an attic on rafters doing electrical work and noticed later that day the pain continued to worsen to the right mid to upper chest.  Denies any cough shortness of breath diaphoresis or nausea.  No history of cardiac disease.  States mild pain currently worse if he moves his right upper extremity or pushes on the chest.   Past Medical History:  Diagnosis Date  . Anxiety   . Fainting spell 11/15/2014  . History of chicken pox   . 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 Urticaria 11/15/2014    Past Surgical History:  Procedure Laterality Date  . CYST REMOVAL HAND Left    arm   . dog bite  2014   right arm  . HERNIA REPAIR  1981   hydrocele repair; pt was 80 months old  . WRIST SURGERY Right 1998   has 1 screw in his wrist    Prior to Admission medications   Medication Sig Start Date End Date Taking? Authorizing Provider  doxycycline (VIBRAMYCIN) 100 MG capsule Take 1 capsule (100 mg total) by mouth 2 (two) times daily. 11/26/19   Sharyn Creamer, MD  ibuprofen (ADVIL) 400 MG tablet Take 400 mg by mouth every 6 (six) hours as needed.    [provider]    No Known Allergies  Family History  Problem Relation Age of Onset  . Atrial fibrillation Mother   . Melanoma Mother   . Colon polyps Mother   . Heart disease Father   . Hyperlipidemia Brother   . Ovarian cancer Maternal Aunt   . Ovarian cancer Maternal Grandmother   . Pancreatic cancer Maternal  Grandfather   . Cirrhosis Brother        alcohol related    Social History Social History   Tobacco Use  . Smoking status: Former Smoker    Packs/day: 1.00    Years: 15.00    Pack years: 15.00    Types: Cigarettes  . Smokeless tobacco: Never Used  Substance Use Topics  . Alcohol use: Yes    Alcohol/week: 6.0 standard drinks    Types: 6 Cans of beer per week  . Drug use: No    Review of Systems Constitutional: Negative for fever. Cardiovascular: Right-sided chest pain Respiratory: Negative for shortness of breath. Gastrointestinal: Negative for abdominal pain, vomiting and diarrhea. Genitourinary: Negative for urinary compaints Musculoskeletal: Negative for musculoskeletal complaints Neurological: Negative for headache All other ROS negative  ____________________________________________   PHYSICAL EXAM:  Constitutional: Alert and oriented. Well appearing and in no distress. Eyes: Normal exam ENT      Head: Normocephalic and atraumatic.      Mouth/Throat: Mucous membranes are moist. Cardiovascular: Normal rate, regular rhythm. No murmur Respiratory: Normal respiratory effort without tachypnea nor retractions. Breath sounds are clear.  Moderate right mid and upper chest tenderness to palpation. Gastrointestinal: Soft and nontender. No distention.   Musculoskeletal: Nontender with normal range of motion in  all extremities Neurologic:  Normal speech and language. No gross focal neurologic deficits  Skin:  Skin is warm, dry and intact.  Psychiatric: Mood and affect are normal.   ____________________________________________    EKG  EKG viewed and interpreted by myself shows a normal sinus rhythm 82 bpm with a narrow QRS, normal axis, normal intervals, no concerning ST changes.  ____________________________________________    RADIOLOGY  Chest x-ray shows age-indeterminate right anterior sixth rib  fracture.  ____________________________________________   INITIAL IMPRESSION / ASSESSMENT AND PLAN / ED COURSE  Pertinent labs & imaging results that were available during my care of the patient were reviewed by me and considered in my medical decision making (see chart for details).   Patient presents emergency department for right anterior chest wall discomfort worse with movement or palpation.  Patient states he was lying on the ground in the attic doing electrical work when he first noticed the pain.  X-ray shows likely right anterior sixth rib fracture which does roughly correlate to the area of the patient's discomfort.  Reassuringly the remainder the patient's work-up including EKG and cardiac enzymes are negative.  Discussed with the patient deep breathing, coughing and techniques to prevent pneumonia.  Tylenol or ibuprofen for discomfort.  Patient agreeable to plan of care.  Paul Contreras was evaluated in Emergency Department on 08/12/2020 for the symptoms described in the history of present illness. He was evaluated in the context of the global COVID-19 pandemic, which necessitated consideration that the patient might be at risk for infection with the SARS-CoV-2 virus that causes COVID-19. Institutional protocols and algorithms that pertain to the evaluation of patients at risk for COVID-19 are in a state of rapid change based on information released by regulatory bodies including the CDC and federal and state organizations. These policies and algorithms were followed during the patient's care in the ED.  ____________________________________________   FINAL CLINICAL IMPRESSION(S) / ED DIAGNOSES  Rib fracture   Minna Antis, MD 08/12/20 1117

## 2020-08-12 NOTE — ED Triage Notes (Signed)
Pt comes with c/o right upper side CP that started on Saturday. Pt states some SOB. Pt denies any N/V/D

## 2020-08-26 ENCOUNTER — Other Ambulatory Visit: Payer: Self-pay | Admitting: Surgery

## 2020-08-28 ENCOUNTER — Other Ambulatory Visit
Admission: RE | Admit: 2020-08-28 | Discharge: 2020-08-28 | Disposition: A | Discharge status: Home / Self Care | Payer: 59 | Source: Ambulatory Visit | Attending: Surgery | Admitting: Surgery

## 2020-08-28 ENCOUNTER — Other Ambulatory Visit: Payer: Self-pay

## 2020-08-28 HISTORY — DX: Other complications of anesthesia, initial encounter: T88.59XA

## 2020-08-28 HISTORY — DX: Nausea with vomiting, unspecified: R11.2

## 2020-08-28 HISTORY — DX: Unspecified osteoarthritis, unspecified site: M19.90

## 2020-08-28 HISTORY — DX: Other specified postprocedural states: Z98.890

## 2020-08-28 HISTORY — DX: Umbilical hernia without obstruction or gangrene: K42.9

## 2020-08-28 NOTE — Patient Instructions (Addendum)
Your procedure is scheduled on: 09/03/20 Report to the Registration Desk on the 1st floor of the [facility]. To find out your arrival time, please call [phone] between 1PM - 3PM on: 09/02/20  REMEMBER: Instructions that are not followed completely may result in serious medical risk, up to and including death; or upon the discretion of your surgeon and anesthesiologist your surgery may need to be rescheduled.  Do not eat food after midnight the night before surgery.  No gum chewing, lozengers or hard candies.  You may however, drink CLEAR liquids up to 2 hours before you are scheduled to arrive for your surgery. Do not drink anything within 2 hours of your scheduled arrival time.  Clear liquids include: - water  - apple juice without pulp - gatorade (not RED, PURPLE, OR BLUE) - black coffee or tea (Do NOT add milk or creamers to the coffee or tea) Do NOT drink anything that is not on this list.  Type 1 and Type 2 diabetics should only drink water.   TAKE THESE MEDICATIONS THE MORNING OF SURGERY WITH A SIP OF WATER: - clonazePAM (KLONOPIN) 1 MG tablet  One week prior to surgery: stop taking your Meloxicam (MOBIC) 15 MG tablet Stop Anti-inflammatories (NSAIDS) such as Advil, Aleve, Ibuprofen, Motrin, Naproxen, Naprosyn and Aspirin based products such as Excedrin, Goodys Powder, BC Powder. May take Tylenol as directed if needed.  Stop ANY OVER THE COUNTER supplements until after surgery. Do not start any new over the counter medications.  No Alcohol for 24 hours before or after surgery.  No Smoking including e-cigarettes for 24 hours prior to surgery.  No chewable tobacco products for at least 6 hours prior to surgery.  No nicotine patches on the day of surgery.  Do not use any "recreational" drugs for at least a week prior to your surgery.  Please be advised that the combination of cocaine and anesthesia may have negative outcomes, up to and including death. If you test  positive for cocaine, your surgery will be cancelled.  On the morning of surgery brush your teeth with toothpaste and water, you may rinse your mouth with mouthwash if you wish. Do not swallow any toothpaste or mouthwash.  Do not wear jewelry, make-up, hairpins, clips or nail polish.  Do not wear lotions, powders, or perfumes.   Do not shave body from the neck down 48 hours prior to surgery just in case you cut yourself which could leave a site for infection.  Also, freshly shaved skin may become irritated if using the CHG soap.  Contact lenses, hearing aids and dentures may not be worn into surgery.  Do not bring valuables to the hospital. [facility] is not responsible for any missing/lost belongings or valuables.   Use Antibacterial Dial Soap the night before and the morning of your surgery, do not apply anything on your skin once you shower.  Notify your doctor if there is any change in your medical condition (cold, fever, infection).  Wear comfortable clothing (specific to your surgery type) to the hospital.  Plan for stool softeners for home use; pain medications have a tendency to cause constipation. You can also help prevent constipation by eating foods high in fiber such as fruits and vegetables and drinking plenty of fluids as your diet allows.  After surgery, you can help prevent lung complications by doing breathing exercises.  Take deep breaths and cough every 1-2 hours. Your doctor may order a device called an Facilities manager to  help you take deep breaths. When coughing or sneezing, hold a pillow firmly against your incision with both hands. This is called "splinting." Doing this helps protect your incision. It also decreases belly discomfort.  If you are being admitted to the hospital overnight, leave your suitcase in the car. After surgery it may be brought to your room.  If you are being discharged the day of surgery, you will not be allowed to drive home. You  will need a responsible adult (18 years or older) to drive you home and stay with you that night.   If you are taking public transportation, you will need to have a responsible adult (18 years or older) with you. Please confirm with your physician that it is acceptable to use public transportation.   Please call the Pre-admissions Testing Dept. at [phone] if you have any questions about these instructions.  Surgery Visitation Policy:  Patients undergoing a surgery or procedure may have one family member or support person with them as long as that person is not COVID-19 positive or experiencing its symptoms.  That person may remain in the waiting area during the procedure.  Inpatient Visitation:    Visiting hours are 7 a.m. to 8 p.m. Inpatients will be allowed two visitors daily. The visitors may change each day during the patient's stay. No visitors under the age of 40. Any visitor under the age of 98 must be accompanied by an adult. The visitor must pass COVID-19 screenings, use hand sanitizer when entering and exiting the patient's room and wear a mask at all times, including in the patient's room. Patients must also wear a mask when staff or their visitor are in the room. Masking is required regardless of vaccination status.

## 2020-09-01 ENCOUNTER — Other Ambulatory Visit: Payer: 59

## 2020-09-02 MED ORDER — APREPITANT 40 MG PO CAPS: 40.0000 mg | ORAL_CAPSULE | Freq: Once | ORAL | Status: AC

## 2020-09-02 MED ORDER — CEFAZOLIN SODIUM-DEXTROSE 2-4 GM/100ML-% IV SOLN
2.0000 g | INTRAVENOUS | Status: AC
  Administered 2020-09-03: 2 g via INTRAVENOUS

## 2020-09-02 MED ORDER — LACTATED RINGERS IV SOLN: INTRAVENOUS | Status: DC

## 2020-09-02 MED ORDER — CHLORHEXIDINE GLUCONATE 0.12 % MT SOLN: 15.0000 mL | Freq: Once | OROMUCOSAL | Status: AC

## 2020-09-02 MED ORDER — FAMOTIDINE 20 MG PO TABS: 20.0000 mg | ORAL_TABLET | Freq: Once | ORAL | Status: DC

## 2020-09-02 MED ORDER — ORAL CARE MOUTH RINSE: 15.0000 mL | Freq: Once | OROMUCOSAL | Status: AC

## 2020-09-03 ENCOUNTER — Ambulatory Visit: Payer: 59 | Admitting: Anesthesiology

## 2020-09-03 ENCOUNTER — Other Ambulatory Visit: Payer: Self-pay

## 2020-09-03 ENCOUNTER — Ambulatory Visit
Admission: RE | Admit: 2020-09-03 | Discharge: 2020-09-03 | Disposition: A | Discharge status: Home / Self Care | Payer: 59 | Attending: Surgery | Admitting: Surgery

## 2020-09-03 ENCOUNTER — Encounter: Payer: Self-pay | Admitting: Surgery

## 2020-09-03 ENCOUNTER — Encounter
Admission: RE | Disposition: A | Discharge status: Home / Self Care | Payer: Self-pay | Source: Home / Self Care | Attending: Surgery

## 2020-09-03 DIAGNOSIS — Z791 Long term (current) use of non-steroidal anti-inflammatories (NSAID): Secondary | ICD-10-CM | POA: Insufficient documentation

## 2020-09-03 DIAGNOSIS — G5601 Carpal tunnel syndrome, right upper limb: Secondary | ICD-10-CM | POA: Diagnosis present

## 2020-09-03 DIAGNOSIS — Z79899 Other long term (current) drug therapy: Secondary | ICD-10-CM | POA: Diagnosis not present

## 2020-09-03 DIAGNOSIS — Z87891 Personal history of nicotine dependence: Secondary | ICD-10-CM | POA: Insufficient documentation

## 2020-09-03 DIAGNOSIS — M720 Palmar fascial fibromatosis [Dupuytren]: Secondary | ICD-10-CM | POA: Insufficient documentation

## 2020-09-03 DIAGNOSIS — R2231 Localized swelling, mass and lump, right upper limb: Secondary | ICD-10-CM | POA: Diagnosis not present

## 2020-09-03 HISTORY — PX: DUPUYTREN CONTRACTURE RELEASE: SHX1478

## 2020-09-03 LAB — URINE DRUG SCREEN, QUALITATIVE (ARMC ONLY)
Amphetamines, Ur Screen: NOT DETECTED
Barbiturates, Ur Screen: NOT DETECTED
Benzodiazepine, Ur Scrn: NOT DETECTED
Cannabinoid 50 Ng, Ur ~~LOC~~: POSITIVE — AB
Cocaine Metabolite,Ur ~~LOC~~: NOT DETECTED
MDMA (Ecstasy)Ur Screen: NOT DETECTED
Methadone Scn, Ur: NOT DETECTED
Opiate, Ur Screen: NOT DETECTED
Phencyclidine (PCP) Ur S: NOT DETECTED
Tricyclic, Ur Screen: NOT DETECTED

## 2020-09-03 SURGERY — RELEASE, DUPUYTREN CONTRACTURE
Anesthesia: General | Site: Ring Finger | Laterality: Right

## 2020-09-03 MED ORDER — DEXAMETHASONE SODIUM PHOSPHATE 10 MG/ML IJ SOLN
INTRAMUSCULAR | Status: DC | PRN
  Administered 2020-09-03: 10 mg via INTRAVENOUS

## 2020-09-03 MED ORDER — ONDANSETRON HCL 4 MG/2ML IJ SOLN: 4.0000 mg | Freq: Once | INTRAMUSCULAR | Status: DC | PRN

## 2020-09-03 MED ORDER — FENTANYL CITRATE (PF) 100 MCG/2ML IJ SOLN
INTRAMUSCULAR | Status: DC | PRN
  Administered 2020-09-03: 50 ug via INTRAVENOUS
  Administered 2020-09-03: 25 ug via INTRAVENOUS
  Administered 2020-09-03: 50 ug via INTRAVENOUS

## 2020-09-03 MED ORDER — ONDANSETRON HCL 4 MG/2ML IJ SOLN
INTRAMUSCULAR | Status: DC | PRN
  Administered 2020-09-03: 4 mg via INTRAVENOUS

## 2020-09-03 MED ORDER — MIDAZOLAM HCL 2 MG/2ML IJ SOLN
INTRAMUSCULAR | Status: DC | PRN
  Administered 2020-09-03: 2 mg via INTRAVENOUS

## 2020-09-03 MED ORDER — DEXMEDETOMIDINE (PRECEDEX) IN NS 20 MCG/5ML (4 MCG/ML) IV SYRINGE
PREFILLED_SYRINGE | INTRAVENOUS | Status: DC | PRN
  Administered 2020-09-03: 20 ug via INTRAVENOUS

## 2020-09-03 MED ORDER — FENTANYL CITRATE (PF) 100 MCG/2ML IJ SOLN
25.0000 ug | INTRAMUSCULAR | Status: DC | PRN
Start: 2020-09-03 — End: 2020-09-03
  Administered 2020-09-03 (×3): 25 ug via INTRAVENOUS

## 2020-09-03 MED ORDER — METOCLOPRAMIDE HCL 10 MG PO TABS
5.0000 mg | ORAL_TABLET | Freq: Three times a day (TID) | ORAL | Status: DC | PRN
Start: 2020-09-03 — End: 2020-09-03

## 2020-09-03 MED ORDER — BUPIVACAINE HCL (PF) 0.5 % IJ SOLN
INTRAMUSCULAR | Status: DC | PRN
  Administered 2020-09-03: 25 mL

## 2020-09-03 MED ORDER — CHLORHEXIDINE GLUCONATE 0.12 % MT SOLN
OROMUCOSAL | Status: AC
  Administered 2020-09-03: 15 mL
  Filled 2020-09-03: qty 15

## 2020-09-03 MED ORDER — ONDANSETRON HCL 4 MG/2ML IJ SOLN: 4.0000 mg | Freq: Four times a day (QID) | INTRAMUSCULAR | Status: DC | PRN

## 2020-09-03 MED ORDER — METOCLOPRAMIDE HCL 5 MG/ML IJ SOLN
5.0000 mg | Freq: Three times a day (TID) | INTRAMUSCULAR | Status: DC | PRN
Start: 2020-09-03 — End: 2020-09-03

## 2020-09-03 MED ORDER — FENTANYL CITRATE (PF) 100 MCG/2ML IJ SOLN
INTRAMUSCULAR | Status: AC
  Filled 2020-09-03: qty 2

## 2020-09-03 MED ORDER — PROPOFOL 10 MG/ML IV BOLUS
INTRAVENOUS | Status: AC
  Filled 2020-09-03: qty 40

## 2020-09-03 MED ORDER — FAMOTIDINE 20 MG PO TABS
ORAL_TABLET | ORAL | Status: AC
  Filled 2020-09-03: qty 1

## 2020-09-03 MED ORDER — LIDOCAINE HCL (CARDIAC) PF 100 MG/5ML IV SOSY
PREFILLED_SYRINGE | INTRAVENOUS | Status: DC | PRN
  Administered 2020-09-03: 80 mg via INTRAVENOUS

## 2020-09-03 MED ORDER — KETOROLAC TROMETHAMINE 30 MG/ML IJ SOLN
INTRAMUSCULAR | Status: DC | PRN
  Administered 2020-09-03: 30 mg via INTRAVENOUS

## 2020-09-03 MED ORDER — CEFAZOLIN SODIUM-DEXTROSE 2-4 GM/100ML-% IV SOLN
INTRAVENOUS | Status: AC
  Filled 2020-09-03: qty 100

## 2020-09-03 MED ORDER — PROPOFOL 10 MG/ML IV BOLUS
INTRAVENOUS | Status: DC | PRN
  Administered 2020-09-03: 100 mg via INTRAVENOUS
  Administered 2020-09-03: 200 mg via INTRAVENOUS

## 2020-09-03 MED ORDER — MIDAZOLAM HCL 2 MG/2ML IJ SOLN
INTRAMUSCULAR | Status: AC
  Filled 2020-09-03: qty 2

## 2020-09-03 MED ORDER — ONDANSETRON HCL 4 MG PO TABS: 4.0000 mg | ORAL_TABLET | Freq: Four times a day (QID) | ORAL | Status: DC | PRN

## 2020-09-03 MED ORDER — SEVOFLURANE IN SOLN
RESPIRATORY_TRACT | Status: AC
  Filled 2020-09-03: qty 250

## 2020-09-03 MED ORDER — POTASSIUM CHLORIDE IN NACL 20-0.9 MEQ/L-% IV SOLN: INTRAVENOUS | Status: DC

## 2020-09-03 MED ORDER — APREPITANT 40 MG PO CAPS
ORAL_CAPSULE | ORAL | Status: AC
  Administered 2020-09-03: 40 mg via ORAL
  Filled 2020-09-03: qty 1

## 2020-09-03 MED ORDER — EPHEDRINE 5 MG/ML INJ
INTRAVENOUS | Status: AC
  Filled 2020-09-03: qty 10

## 2020-09-03 MED ORDER — BUPIVACAINE HCL (PF) 0.5 % IJ SOLN
INTRAMUSCULAR | Status: AC
  Filled 2020-09-03: qty 30

## 2020-09-03 MED ORDER — PROPOFOL 500 MG/50ML IV EMUL
INTRAVENOUS | Status: AC
  Filled 2020-09-03: qty 50

## 2020-09-03 MED ORDER — HYDROCODONE-ACETAMINOPHEN 5-325 MG PO TABS
ORAL_TABLET | ORAL | Status: AC
  Administered 2020-09-03: 2 via ORAL
  Filled 2020-09-03: qty 2

## 2020-09-03 MED ORDER — HYDROCODONE-ACETAMINOPHEN 5-325 MG PO TABS: 1.0000 | ORAL_TABLET | ORAL | Status: DC | PRN

## 2020-09-03 MED ORDER — HYDROCODONE-ACETAMINOPHEN 5-325 MG PO TABS: 1.0000 | ORAL_TABLET | Freq: Four times a day (QID) | ORAL | 0 refills | Status: DC | PRN

## 2020-09-03 MED ORDER — FENTANYL CITRATE (PF) 100 MCG/2ML IJ SOLN
INTRAMUSCULAR | Status: AC
  Administered 2020-09-03: 25 ug via INTRAVENOUS
  Filled 2020-09-03: qty 2

## 2020-09-03 SURGICAL SUPPLY — 38 items
APL PRP STRL LF DISP 70% ISPRP (MISCELLANEOUS) ×1
BLADE SURG 15 STRL LF DISP TIS (BLADE) ×1 IMPLANT
BLADE SURG 15 STRL SS (BLADE) ×2
BNDG CONFORM 2 STRL LF (GAUZE/BANDAGES/DRESSINGS) IMPLANT
BNDG ELASTIC 2X5.8 VLCR STR LF (GAUZE/BANDAGES/DRESSINGS) ×6 IMPLANT
BNDG ELASTIC 3X5.8 VLCR STR LF (GAUZE/BANDAGES/DRESSINGS) ×2 IMPLANT
BNDG ESMARK 4X12 TAN STRL LF (GAUZE/BANDAGES/DRESSINGS) ×2 IMPLANT
CHLORAPREP W/TINT 26 (MISCELLANEOUS) ×2 IMPLANT
CORD BIP STRL DISP 12FT (MISCELLANEOUS) ×2 IMPLANT
COVER WAND RF STERILE (DRAPES) ×2 IMPLANT
CUFF TOURN SGL QUICK 18X4 (TOURNIQUET CUFF) ×2 IMPLANT
DRAPE ORTHO SPLIT 77X108 STRL (DRAPES) ×2
DRAPE SURG 17X11 SM STRL (DRAPES) ×2 IMPLANT
DRAPE SURG ORHT 6 SPLT 77X108 (DRAPES) ×1 IMPLANT
ELECT REM PT RETURN 9FT ADLT (ELECTROSURGICAL)
ELECTRODE REM PT RTRN 9FT ADLT (ELECTROSURGICAL) IMPLANT
FORCEPS JEWEL BIP 4-3/4 STR (INSTRUMENTS) ×2 IMPLANT
GAUZE SPONGE 4X4 12PLY STRL (GAUZE/BANDAGES/DRESSINGS) ×2 IMPLANT
GAUZE XEROFORM 1X8 LF (GAUZE/BANDAGES/DRESSINGS) ×2 IMPLANT
GLOVE SURG ENC MOIS LTX SZ8 (GLOVE) ×4 IMPLANT
GLOVE SURG PR MICRO ENCORE 7.5 (GLOVE) ×2 IMPLANT
GLOVE SURG UNDER LTX SZ8 (GLOVE) ×2 IMPLANT
GOWN STRL REUS W/ TWL LRG LVL3 (GOWN DISPOSABLE) ×1 IMPLANT
GOWN STRL REUS W/ TWL XL LVL3 (GOWN DISPOSABLE) ×1 IMPLANT
GOWN STRL REUS W/TWL LRG LVL3 (GOWN DISPOSABLE) ×2
GOWN STRL REUS W/TWL XL LVL3 (GOWN DISPOSABLE) ×2
KIT TURNOVER KIT A (KITS) ×2 IMPLANT
MANIFOLD NEPTUNE II (INSTRUMENTS) ×2 IMPLANT
NS IRRIG 1000ML POUR BTL (IV SOLUTION) ×2 IMPLANT
NS IRRIG 500ML POUR BTL (IV SOLUTION) ×2 IMPLANT
PACK EXTREMITY ARMC (MISCELLANEOUS) ×2 IMPLANT
PAD PREP 24X41 OB/GYN DISP (PERSONAL CARE ITEMS) IMPLANT
SPONGE GAUZE 2X2 8PLY STRL LF (GAUZE/BANDAGES/DRESSINGS) IMPLANT
STOCKINETTE IMPERVIOUS 9X36 MD (GAUZE/BANDAGES/DRESSINGS) ×2 IMPLANT
STOCKINETTE STRL 4IN 9604848 (GAUZE/BANDAGES/DRESSINGS) ×2 IMPLANT
SUT PROLENE 4 0 PS 2 18 (SUTURE) ×8 IMPLANT
SUT VIC AB 3-0 SH 27 (SUTURE) ×2
SUT VIC AB 3-0 SH 27X BRD (SUTURE) ×1 IMPLANT

## 2020-09-03 NOTE — Anesthesia Postprocedure Evaluation (Signed)
Anesthesia Post Note  Patient: Paul Contreras  Procedure(s) Performed: ENDOSCOPIC RIGHT CARPAL TUNNEL RELEASE WITH EXCISION OF A DUPUYTREN'S CONTRACTURE OF RIGHT RING FINGER, masses on right indes/long/ring PIP joints (Right Ring Finger)  Patient location during evaluation: PACU Anesthesia Type: General Level of consciousness: awake and alert Pain management: pain level controlled Vital Signs Assessment: post-procedure vital signs reviewed and stable Respiratory status: spontaneous breathing, nonlabored ventilation and respiratory function stable Cardiovascular status: blood pressure returned to baseline and stable Postop Assessment: no apparent nausea or vomiting Anesthetic complications: no   No complications documented.   Last Vitals:  Vitals:   09/03/20 1024 09/03/20 1036  BP: (!) 135/99 (!) 133/91  Pulse: 84 84  Resp: 13 18  Temp: 36.6 C 36.4 C  SpO2: 97% 98%    Last Pain:  Vitals:   09/03/20 1036  TempSrc: Temporal  PainSc: 0-No pain                 Aurelio Brash Noelani Harbach

## 2020-09-03 NOTE — Discharge Instructions (Addendum)
Orthopedic discharge instructions: Keep dressing dry and intact. Keep hand elevated above heart level. May shower after dressing removed on postop day 4 (Wednesday). Cover sutures with Band-Aids after drying off, then reapply Velcro splint. Apply ice to affected area frequently. Take meloxicam 15 mg daily OR ibuprofen 600-800 mg TID with meals for 7-10 days, then as necessary. Take ES Tylenol or pain medication as prescribed when needed.  Return for follow-up in 10-14 days or as scheduled.   AMBULATORY SURGERY  DISCHARGE INSTRUCTIONS   1) The drugs that you were given will stay in your system until tomorrow so for the next 24 hours you should not:  A) Drive an automobile B) Make any legal decisions C) Drink any alcoholic beverage   2) You may resume regular meals tomorrow.  Today it is better to start with liquids and gradually work up to solid foods.  You may eat anything you prefer, but it is better to start with liquids, then soup and crackers, and gradually work up to solid foods.   3) Please notify your doctor immediately if you have any unusual bleeding, trouble breathing, redness and pain at the surgery site, drainage, fever, or pain not relieved by medication.    4) Additional Instructions:        Please contact your physician with any problems or Same Day Surgery at [phone], Monday through Friday 6 am to 4 pm, or [place] at [facility] number at [phone].

## 2020-09-03 NOTE — H&P (Signed)
History of Present Illness:  Paul Contreras is a 40 y.o. male who presents for evaluation and treatment of his bilateral hand and wrist pain and paresthesias, right more symptomatic than left. The patient notes that his right hand symptoms have been present for about 15 years. He denies any specific injury that may have contributed to the onset of the symptoms, although he does recall sustaining what sounds like a scaphoid fracture for which he underwent a bone grafting and screw fixation about 20 years ago. He has noted limitation in wrist motion since that injury. His symptoms of pain and paresthesias have progressively worsened, especially over the past few years. 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 His symptoms are aggravated by repetitive activities, as well as at night. He has been taking Mobic and or ibuprofen as necessary with limited benefit. He also tried wearing splints at night which provided little if any relief. He saw Dedra Skeens, PA-C, who sent him for EMG studies of both hands and referred him to me for further evaluation and treatment.  The patient also notes the development of a thickened cord along the ring fingers of both hands, left more symptomatic than right. These cords have been progressing over the past few years. He notes that his twin brother also has them. He denies any pain with them, but finds that they do interfere with his ability to make a fist and seem to limit his grip strength.  Current Outpatient Medications: . meloxicam (MOBIC) 15 MG tablet Take 15 mg by mouth once daily  . clonazePAM (KLONOPIN) 1 MG tablet Take 1 mg by mouth 2 (two) times daily  . ibuprofen (MOTRIN) 400 MG tablet Take 1 tablet by mouth every 6 (six) hours as needed   Allergies: No Known Allergies  Past Medical History:  . Anxiety  . Arthritis   Past Surgical History:  . dog bite 2012 (surgery on right arm)  . EXCISION GANGLION CYST LEFT WRIST PRIMARY  . FRACTURE  SURGERY Right 1998 (Screw in place)  . GRAFT SINGLE NERVE RIGHT HAND  . HERNIA REPAIR 1981 (Hydrocele)   Family History:  . Atrial fibrillation (Abnormal heart rhythm sometimes requiring treatment with blood thinners) Mother  . Melanoma Mother  . Hyperlipidemia (Elevated cholesterol) Mother  . High blood pressure (Hypertension) Mother  . Heart disease Father  . Prostate cancer Father  . Hyperlipidemia (Elevated cholesterol) Father  . Hyperlipidemia (Elevated cholesterol) Brother  . Cirrhosis Brother  . Dementia Maternal Grandmother  . Pancreatic cancer Maternal Grandfather  . Dementia Paternal Grandfather   Social History:   Socioeconomic History:  Marland Kitchen Marital status: Married  Occupational History  . Occupation: Personnel officer  Tobacco Use  . Smoking status: Former Smoker  Packs/day: 1.00  Years: 15.00  Pack years: 15.00  Types: Cigarettes  . Smokeless tobacco: Current User  . Tobacco comment: quit smoking 2011  Vaping Use  . Vaping Use: Some days  Substance and Sexual Activity  . Alcohol use: Yes  Alcohol/week: 6.0 standard drinks  Types: 6 Cans of beer per week  Comment: 6 cans beer per week  . Drug use: Yes  Comment: Marijuana  . Sexual activity: Yes  Partners: Female  Birth control/protection: None   Review of Systems:  A comprehensive 14 point ROS was performed, reviewed, and the pertinent orthopaedic findings are documented in the HPI.  Physical Exam: Vitals:  08/22/20 0905  BP: 118/76  Weight: 61.4 kg (135 lb 6.4 oz)  Height:  172.7 cm (5\' 8" )  PainSc: 3  PainLoc: Hand   General/Constitutional: The patient appears to be well-nourished, well-developed, and in no acute distress. Neuro/Psych: Normal mood and affect, oriented to person, place and time. Eyes: Non-icteric. Pupils are equal, round, and reactive to light, and exhibit synchronous movement. ENT: Unremarkable. Lymphatic: No palpable adenopathy. Respiratory: Lungs clear to auscultation, Normal chest  excursion, No wheezes and Non-labored breathing Cardiovascular: Regular rate and rhythm. No murmurs. and No edema, swelling or tenderness, except as noted in detailed exam. Integumentary: No impressive skin lesions present, except as noted in detailed exam. Musculoskeletal: Unremarkable, except as noted in detailed exam.  Right wrist/hand exam: Skin inspection of the right hand is notable for a thickened cord along the palmar aspect of his ring finger metacarpal extending from the proximal palmar crease to the MCP flexion crease. This results in approximately 10 to 15 degree flexion contracture of the MCP joint, but does not appear to involve the PIP or DIP joints. There is no tenderness to palpation along this cord. There are some soft tissue prominences over the dorsal aspects of his index, long, and ring PIP joints (possibly consistent with calluses as the patient notes he needs to push himself up from the floor with his fists rather than with the palm of his hand due to limited wrist motion). The patient is able to extend his wrist to 45 degrees and flex his wrist to 40 degrees. He lacks a centimeter from achieving his fingertips to the proximal palmar crease when he attempts to make a fist. He is able active flex and extend all digits without any triggering, but does have pain on the dorsal aspect of his long finger MCP joint with flexion beyond 80 degrees. He is neurovascularly intact to all digits, other than some subjectively decreased sensation to light touch to the tips of his thumb, index, and long fingers. He has a positive Tinel's over the carpal tunnel, and a positive Phalen's test.  X-rays/MRI/Lab data:  AP, lateral, and oblique views of the right hand and wrist are obtained. These films demonstrate moderate posttraumatic degenerative changes of the carpus. There is a Herbert screw in the scaphoid, consistent with his history of a prior surgical fixation of the scaphoid fracture. The fracture  itself appears to be well-healed. The screw itself is in good position and without evidence of loosening or prominence. No acute fractures, lytic lesions, or other significant degenerative changes are noted.  AP, lateral, and oblique views of the left hand and wrist are obtained. These films demonstrate no evidence for fractures, lytic lesions, or significant degenerative changes.  Assessment: 1. Carpal tunnel syndrome, right.  2. Dupuytren's contracture of right ring finger.  3. Soft tissue masses dorsum of right index, long, and ring PIP joints.  Plan: The treatment options were discussed with the patient and his wife. In addition, patient educational materials were provided regarding the diagnosis and treatment options. The patient is quite frustrated by his symptoms and functional limitations, and is ready to consider more aggressive treatment options. Given that his carpal tunnel symptoms are more severe on the right, I have suggested that we do the right side first. Specifically, I have recommended a surgical procedure to include an endoscopic right carpal tunnel release with debridement of his Dupuytren's contracture involving the ring finger and excision of the soft tissue masses on the dorsum of his right index, long, and ring finger PIP joints. The procedures were discussed with the patient, as were the  potential risks (including bleeding, infection, nerve and/or blood vessel injury, persistent or recurrent pain/paresthesias/weakness/stiffness, recurrence of the masses/contractures, need for further surgery, blood clots, strokes, heart attacks and/or arhythmias, pneumonia, etc.) and benefits. The patient states his understanding and wishes to proceed. All of the patient's questions and concerns were answered. He can call any time with further concerns. He will follow up post-surgery, routine.   H&P reviewed and patient re-examined. No changes.

## 2020-09-03 NOTE — Op Note (Signed)
09/03/2020  9:45 AM  Patient:   Paul Contreras  Pre-Op Diagnosis:   1. Right carpal tunnel syndrome.   2. Dupuytren's contracture right long finger. 3. Soft tissue masses dorsum of right index, long, and ring PIP joints.  Post-Op Diagnosis:   Same.  Procedure:   1. Endoscopic right carpal tunnel release.   2. Excision of Dupuytren's contracture, right long finger. 3. Excision of soft tissue masses over dorsum of right index, long, and ring PIP joints.  Surgeon:   Maryagnes Amos, MD  Anesthesia:   General LMA  Findings:   As above.  Complications:   None  EBL:   3 cc  Fluids:   700 cc crystalloid  TT:   76 minutes at 250 mmHg  Drains:   None  Closure:   4-0 Prolene interrupted sutures  Brief Clinical Note:   The patient is a 40 year old male with a long history of progressively worsening pain and paresthesias to his right hand. His symptoms have progressed despite medications, activity modification, etc. His history and examination are consistent with carpal tunnel syndrome, confirmed by EMG. The patient presents at this time for an endoscopic right carpal tunnel release.  The patient also notes a thickened cord like structure in the palmar aspect of his hand along the ring metacarpal. 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    Finally, the patient notes soft tissue masses over the dorsal aspects of the index, long, and ring PIP joints. He finds these to be painful if he accidentally bumps them and would like to have them removed as well.  Procedure:   The patient was brought into the operating room and lain in the supine position. After adequate general laryngeal mask anesthesia was obtained, the right hand and upper extremity were prepped with ChloraPrep solution before being draped  sterilely. Preoperative antibiotics were administered. A timeout was performed to verify the appropriate surgical site before the limb was exsanguinated with an Esmarch and the tourniquet inflated to 250 mmHg.   An approximately 1.5-2 cm incision was made over the volar wrist flexion crease, centered over the palmaris longus tendon. The incision was carried down through the subcutaneous tissues with care taken to identify and protect any neurovascular structures. The distal forearm fascia was penetrated just proximal to the transverse carpal ligament. The soft tissues were released off the superficial and deep surfaces of the distal forearm fascia and this was released proximally for 3-4 cm under direct visualization.  Attention was directed distally. The Therapist, nutritional was passed beneath the transverse carpal ligament along the ulnar aspect of the carpal tunnel and used to release any adhesions as well as to remove any adherent synovial tissue before first the smaller then the larger of the two dilators were passed beneath the transverse carpal ligament along the ulnar margin of the carpal tunnel. The slotted cannula was introduced and the endoscope was placed into the slotted cannula and the undersurface of the transverse carpal ligament visualized. The distal margin of the transverse carpal ligament was marked by placing a 25-gauge needle percutaneously at Kaplan's cardinal point so that it entered the distal portion of the slotted cannula. Under endoscopic visualization, the transverse carpal ligament was released from proximal to distal using the end-cutting blade. A second pass was  performed to ensure complete release of the ligament. The adequacy of release was verified both endoscopically and by palpation using the freer elevator.  Next, the Dupuytren's contracture in the palm of his right hand was addressed. A Brunner type zigzag incision was made along the volar aspect of the right ring finger  beginning near the thenar crease and extending to the MCP flexion crease. The incision was carried down through subcutaneous tissues. The fibrous cord was identified and carefully dissected out from proximal to distal after releasing it proximally. As dissection was carried out, care was taken to identify and protect the common digital nerve and artery on either side of the cord, as well as the underlying flexor tendon. After the mass was removed in its entirety, the adequacy of excision was verified by palpation as well as visually.  Finally, the masses on the dorsal aspect of the right index, long, and ring PIP joints were addressed.  A curvilinear incision was made over the PIP joint of the index finger and carried down through the subcutaneous tissues.  The flap was elevated to expose the dorsal aspect of the PIP joint.  The mass appeared to be some thickened bursal tissue as well as some underlying degenerative tendinopathic tissues arising from the extensor mechanism.  This area was carefully debrided/debulked.  Similar procedures were performed over the long and ring PIP joints.  The wounds all were irrigated thoroughly with sterile saline solution before being closed using 4-0 Prolene interrupted sutures. A total of 25 cc of 0.5% plain Sensorcaine was injected in and around the incisions before a sterile bulky dressing was applied to the wounds. The patient was placed into a volar wrist splint before being awakened, extubated, and returned to the recovery room in satisfactory condition after tolerating the procedures well.

## 2020-09-03 NOTE — Transfer of Care (Signed)
Immediate Anesthesia Transfer of Care Note  Patient: Paul Contreras  Procedure(s) Performed: ENDOSCOPIC RIGHT CARPAL TUNNEL RELEASE WITH EXCISION OF A DUPUYTREN'S CONTRACTURE OF RIGHT RING FINGER, masses on right indes/long/ring PIP joints (Right Ring Finger)  Patient Location: PACU  Anesthesia Type:General  Level of Consciousness: awake  Airway & Oxygen Therapy: Patient Spontanous Breathing  Post-op Assessment: Report given to RN  Post vital signs: stable  Last Vitals:  Vitals Value Taken Time  BP    Temp    Pulse    Resp    SpO2      Last Pain:  Vitals:   09/03/20 0710  TempSrc:   PainSc: 0-No pain      Patients Stated Pain Goal: 0 (09/03/20 0710)  Complications: No complications documented.

## 2020-09-03 NOTE — Anesthesia Preprocedure Evaluation (Signed)
Anesthesia Evaluation  Patient identified by MRN, date of birth, ID band Patient awake    Reviewed: Allergy & Precautions, NPO status , Patient's Chart, lab work & pertinent test results  History of Anesthesia Complications (+) PONV and history of anesthetic complications  Airway Mallampati: II       Dental   Pulmonary neg sleep apnea, neg COPD, Not current smoker, former smoker,           Cardiovascular (-) hypertension(-) Past MI and (-) CHF (-) dysrhythmias (-) Valvular Problems/Murmurs     Neuro/Psych neg Seizures Anxiety    GI/Hepatic Neg liver ROS, neg GERD  ,  Endo/Other  neg diabetes  Renal/GU negative Renal ROS     Musculoskeletal   Abdominal   Peds  Hematology   Anesthesia Other Findings   Reproductive/Obstetrics                             Anesthesia Physical Anesthesia Plan  ASA: II  Anesthesia Plan: General   Post-op Pain Management:    Induction: Intravenous  PONV Risk Score and Plan: 3 and Ondansetron, Dexamethasone, Aprepitant and Midazolam  Airway Management Planned: LMA  Additional Equipment:   Intra-op Plan:   Post-operative Plan:   Informed Consent: I have reviewed the patients History and Physical, chart, labs and discussed the procedure including the risks, benefits and alternatives for the proposed anesthesia with the patient or authorized representative who has indicated his/her understanding and acceptance.       Plan Discussed with:   Anesthesia Plan Comments:         Anesthesia Quick Evaluation

## 2020-09-08 LAB — SURGICAL PATHOLOGY

## 2020-09-30 ENCOUNTER — Other Ambulatory Visit: Payer: Self-pay

## 2020-09-30 ENCOUNTER — Encounter: Payer: Self-pay | Admitting: Occupational Therapy

## 2020-09-30 ENCOUNTER — Ambulatory Visit: Payer: 59 | Attending: Student | Admitting: Occupational Therapy

## 2020-09-30 DIAGNOSIS — L905 Scar conditions and fibrosis of skin: Secondary | ICD-10-CM | POA: Diagnosis present

## 2020-09-30 DIAGNOSIS — M6281 Muscle weakness (generalized): Secondary | ICD-10-CM | POA: Diagnosis present

## 2020-09-30 DIAGNOSIS — M79641 Pain in right hand: Secondary | ICD-10-CM | POA: Insufficient documentation

## 2020-09-30 DIAGNOSIS — M25631 Stiffness of right wrist, not elsewhere classified: Secondary | ICD-10-CM | POA: Insufficient documentation

## 2020-09-30 DIAGNOSIS — M25641 Stiffness of right hand, not elsewhere classified: Secondary | ICD-10-CM | POA: Diagnosis present

## 2020-09-30 NOTE — Therapy (Signed)
[facility] [address], [place], [address] Phone: [phone]   Fax:  [phone]  Occupational Therapy Evaluation  Patient Details  Name: Paul Contreras MRN: [id number] Date of Birth: [birth year] Referring Provider (OT): Dr Joice Lofts   Encounter Date: 09/30/2020   OT End of Session - 09/30/20 1825    Visit Number 1    Number of Visits 14    Date for OT Re-Evaluation 11/25/20    OT Start Time 1222    OT Stop Time 1316    OT Time Calculation (min) 54 min    Activity Tolerance Patient tolerated treatment well    Behavior During Therapy [facility] for tasks assessed/performed           Past Medical History:  Diagnosis Date  . Anxiety   . Arthritis   . Complication of anesthesia    nausea  . Fainting spell 11/15/2014  . History of chicken pox   . Kawasaki disease (HCC)   . PONV (postoperative nausea and vomiting)   . Umbilical hernia     Past Surgical History:  Procedure Laterality Date  . CYST REMOVAL HAND Left    arm   . dog bite  2014   right arm  . DUPUYTREN CONTRACTURE RELEASE Right 09/03/2020   Procedure: ENDOSCOPIC RIGHT CARPAL TUNNEL RELEASE WITH EXCISION OF A DUPUYTREN'S CONTRACTURE OF RIGHT RING FINGER, masses on right indes/long/ring PIP joints;  Surgeon: Christena Flake, MD;  Location: [facility] ORS;  Service: Orthopedics;  Laterality: Right;  . HERNIA REPAIR  1981   hydrocele repair; pt was 42 months old  . WRIST SURGERY Right 1998   has 1 screw in his wrist    There were no vitals filed for this visit.   Subjective Assessment - 09/30/20 1356    Subjective  They took my stitches out - my incision still open in my palm and doing some dressing changes. My fingers are swollen and stiff -and wrist stiff - some numbness in fingers but it is improving    Pertinent History Dr Joice Lofts on 09/03/20 done  Endoscopic right carpal tunnel release.    2. Excision of Dupuytren's contracture, right long finger.  3.  Excision of soft tissue masses over dorsum of right index, long, and ring PIP joints.   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 Yes    Pain Score 8     Pain Orientation Right    Pain Descriptors / Indicators Aching;Tender;Tightness    Pain Type Surgical pain    Pain Onset More than a month ago    Pain Frequency Intermittent    Aggravating Factors  making fist and opening or stretching fingers             [facility] OT Assessment - 09/30/20 0001      Assessment   Medical Diagnosis R CTR and dupuytrents release 3rd and palm    Referring Provider (OT) Dr Joice Lofts    Onset Date/Surgical Date 09/03/20    Hand Dominance Right    Next MD Visit 9th June      Precautions   Required Braces or Orthoses --   fabricate dorsal extention splint for 3rd thru 5th for night time  Home  Environment   Lives With Family      Prior Function   Vocation Full time employment    Leisure Personnel officerlectrician, 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      AROM   Right Wrist Extension 54 Degrees    Right Wrist Flexion 62 Degrees    Left Wrist Extension 72 Degrees      Right Hand AROM   R Thumb Opposition to Index --   Opposition to tips   R Index  MCP 0-90 70 Degrees    R Index PIP 0-100 100 Degrees    R Long  MCP 0-90 65 Degrees    R Long PIP 0-100 95 Degrees   -15   R Ring  MCP 0-90 65 Degrees    R Ring PIP 0-100 95 Degrees   -15   R Little  MCP 0-90 80 Degrees    R Little PIP 0-100 95 Degrees   -15                   OT Treatments/Exercises (OP) - 09/30/20 0001      RUE Contrast Bath   Time 8 minutes    Comments prior to review of HEP          Pt ed on doing contrast  And then tubigrip with dressing over palmar incision -and light rolling over red foam roller - light 20reps  AAROM on table digits extention   Tendon glides - and  composite fist to 2 cm foam block Opposition to all digits  Wrist ext and flexion AAROM - pain free - light stretch at wrist  Fabricated dorsal hand base extention splint for night time use - for 3rd thru 5th digit-       OT Education - 09/30/20 1824    Education Details findings of eval, HEP and splint wearing    Person(s) Educated Patient    Methods Demonstration;Explanation;Tactile cues;Verbal cues;Handout    Comprehension Verbal cues required;Returned demonstration;Verbalized understanding            OT Short Term Goals - 09/30/20 1833      OT SHORT TERM GOAL #1   Title Pt wear and tolerate night extention splint for Exention of R digits [facility] to be able to wash face and donn gloves    Baseline decrease -15 3rd thru 5th PIP's    Time 4    Period Weeks    Status New    Target Date 10/28/20             OT Long Term Goals - 09/30/20 1834      OT LONG TERM GOAL #1   Title R  hand  incision heal , and dscar tissue and edema decrease for pt to show increase AROM to make tight fist to grip utenciles and tools    Baseline MC's flexion 65-80 with pain and tightness, PIP flexion 90 to 95    Time 5    Period Weeks    Status New    Target Date 11/04/20      OT LONG TERM GOAL #2   Title R hand incision healt and scar improve for pt to grip objects and use hand in ADL's and work tools without increase tenderness or pain or symptoms less than 2/10    Baseline pain increase 8/10 scar , and PROM - and not able grip any objects- incision open    Time 8    Period Weeks  Status New    Target Date 11/25/20      OT LONG TERM GOAL #3   Title R wrist AROM and strength increase to [facility] and same as prior to surgery to return to work    Baseline CTS - healed but tight -and decrease flexion 62, ext 54 - but had scaphoid fx with ORIF in past and did not had normal AROM - not using hand in any ADL's or IADL's yet    Time 8    Period Weeks    Status New    Target Date  11/25/20      OT LONG TERM GOAL #4   Title R  grip and prehension strength increase to more than 75% compare to L hand to grip tools and utenciles and return to prior level of function    Baseline incision still open- stiffness and edema in digits- decrease flexion and extention of dgiits    Time 8    Period Weeks    Status New    Target Date 11/25/20                 Plan - 09/30/20 1827    Clinical Impression Statement Pt is 3 1/2 wks s/p R hand endoscopy CTR and dupuytrens release of palm and 3rd digit - pt with incision still open in palm about 2 cm - and doing dressing changes per surgeon instruction - pt show decrease extention and flexion of 3rd thru 5th digits, increase edema/ pain  in R hand , scar tissue and open incision- CTR scar closed - but pt with decrease wrist ext and flexion -but per pt had decrease ROM because of old ORIF for scaphoid fx years ago - decrease functional use of R dominant hand in ADL's and IADL's - pt occupation is Personnel officer - ed on modifications and joint protection in future    OT Occupational Profile and History Problem Focused Assessment - Including review of records relating to presenting problem    Occupational performance deficits (Please refer to evaluation for details): ADL's;IADL's;Work;Play;Leisure;Social Participation;Rest and Sleep    Body Structure / Function / Physical Skills ADL;Decreased knowledge of precautions;Coordination;Edema;Dexterity;Decreased knowledge of use of DME;Strength;Pain;UE functional use;ROM;IADL;Scar mobility;Skin integrity;FMC;Flexibility    Rehab Potential Good    Clinical Decision Making Limited treatment options, no task modification necessary    Comorbidities Affecting Occupational Performance: None    Modification or Assistance to Complete Evaluation  No modification of tasks or assist necessary to complete eval    OT Frequency 2x / week    OT Duration 8 weeks    OT Treatment/Interventions Self-care/ADL  training;Paraffin;Fluidtherapy;Contrast Bath;Therapeutic exercise;Manual Therapy;DME and/or AE instruction;Splinting;Therapeutic activities;Scar mobilization;Passive range of motion;Patient/family education    Consulted and Agree with Plan of Care Patient           Patient will benefit from skilled therapeutic intervention in order to improve the following deficits and impairments:   Body Structure / Function / Physical Skills: ADL,Decreased knowledge of precautions,Coordination,Edema,Dexterity,Decreased knowledge of use of DME,Strength,Pain,UE functional use,ROM,IADL,Scar mobility,Skin integrity,FMC,Flexibility       Visit Diagnosis: Scar condition and fibrosis of skin - Plan: Ot plan of care cert/re-cert  Stiffness of right hand, not elsewhere classified - Plan: Ot plan of care cert/re-cert  Stiffness of right wrist, not elsewhere classified - Plan: Ot plan of care cert/re-cert  Pain in right hand - Plan: Ot plan of care cert/re-cert  Muscle weakness (generalized) - Plan: Ot plan of care cert/re-cert    Problem List  Patient Active Problem List   Diagnosis Date Noted  . Urticaria 11/15/2014    Oletta Cohn OTR/L,CLT 09/30/2020, 6:42 PM  [facility] [address] [place], [address] Phone: [phone]   Fax:  [phone]  Name: LEVESTER WALDRIDGE MRN: [id number] Date of Birth: 1980/05/22

## 2020-10-07 ENCOUNTER — Ambulatory Visit: Payer: 59 | Admitting: Occupational Therapy

## 2020-10-07 DIAGNOSIS — M25641 Stiffness of right hand, not elsewhere classified: Secondary | ICD-10-CM

## 2020-10-07 DIAGNOSIS — L905 Scar conditions and fibrosis of skin: Secondary | ICD-10-CM | POA: Diagnosis not present

## 2020-10-07 DIAGNOSIS — M6281 Muscle weakness (generalized): Secondary | ICD-10-CM

## 2020-10-07 DIAGNOSIS — M25631 Stiffness of right wrist, not elsewhere classified: Secondary | ICD-10-CM

## 2020-10-07 DIAGNOSIS — M79641 Pain in right hand: Secondary | ICD-10-CM

## 2020-10-07 NOTE — Therapy (Signed)
[facility] [address] [place], [address] Phone: [phone]   Fax:  [phone]  Occupational Therapy Treatment  Patient Details  Name: Paul Contreras MRN: [id number] Date of Birth: [birth year] Referring Provider (OT): Dr Joice Lofts   Encounter Date: 10/07/2020   OT End of Session - 10/07/20 1112     Visit Number 2    Number of Visits 14    Date for OT Re-Evaluation 11/25/20    OT Start Time 1112    OT Stop Time 1145    OT Time Calculation (min) 33 min    Activity Tolerance Patient tolerated treatment well    Behavior During Therapy [facility] for tasks assessed/performed             Past Medical History:  Diagnosis Date   Anxiety    Arthritis    Complication of anesthesia    nausea   Fainting spell 11/15/2014   History of chicken pox    Kawasaki disease (HCC)    PONV (postoperative nausea and vomiting)    Umbilical hernia     Past Surgical History:  Procedure Laterality Date   CYST REMOVAL HAND Left    arm    dog bite  2014   right arm   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   Subjective Assessment - 10/07/20 1111     Subjective  Seen the Dr since last time -and motion better- but trying to keep my pain under 2/10 - did not listen in the past the my therapist - this time I am listening- scar is healing    Pertinent History Dr Joice Lofts on 09/03/20 done  Endoscopic right carpal tunnel release.    2. Excision of Dupuytren's contracture, right long finger.  3. Excision of soft tissue  masses over dorsum of right index, long, and ring PIP joints.   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 Yes    Pain Score 2     Pain Location Hand    Pain Orientation Right    Pain Descriptors / Indicators Aching;Tightness    Pain Type Surgical pain    Pain Onset More than a month ago    Pain Frequency Intermittent    Aggravating Factors  fisting                [facility] OT Assessment - 10/07/20 0001       AROM   Right Wrist Extension 50 Degrees    Right Wrist Flexion 62 Degrees      Right Hand AROM   R Thumb Opposition to Index --   Opposition to all digits with more ease   R Index  MCP 0-90 70 Degrees    R Index PIP 0-100 100 Degrees  R Long  MCP 0-90 70 Degrees    R Long PIP 0-100 100 Degrees   -10   R Ring  MCP 0-90 72 Degrees    R Ring PIP 0-100 100 Degrees   -10   R Little  MCP 0-90 80 Degrees    R Little PIP 0-100 -10 Degrees              Pt show increase extention of digits and flexion - cont to be mostly limited in MC flexion - scar still healing  Pt ed on scar massage to CTS, proximal and distal part of palmar scar  And can use cica scar pad on CTS and proximal scar at night time under tubigrip  1 cm part of incision still need to close        OT Treatments/Exercises (OP) - 10/07/20 0001       RUE Contrast Bath   Time 8 minutes    Comments prior to ROM - decrease edema and stiffness              Pt ed on cont contrast And then tubigrip with dressing over palmar incision -and light rolling over red foam roller - light 20reps  AAROM on table digits extention   Tendon glides - and composite fist to 2 cm foam block pain free Opposition to all digits  Wrist ext and flexion AAROM - pain free - light stretch at wrist  cont with dorsal hand base extention splint for  night time use - for 3rd thru 5th digit Able to tolerate it the last 2 nights better      OT Education - 10/07/20 1112     Education Details progress and changes to HEP    Person(s) Educated Patient    Methods Demonstration;Explanation;Tactile cues;Verbal cues;Handout    Comprehension Verbal cues required;Returned demonstration;Verbalized understanding              OT Short Term Goals - 09/30/20 1833       OT SHORT TERM GOAL #1   Title Pt wear and tolerate night extention splint for Exention of R digits improve to [facility] to be able to wash face and donn gloves    Baseline decrease -15 3rd thru 5th PIP's    Time 4    Period Weeks    Status New    Target Date 10/28/20               OT Long Term Goals - 09/30/20 1834       OT LONG TERM GOAL #1   Title R  hand  incision heal , and dscar tissue and edema decrease for pt to show increase AROM to make tight fist to grip utenciles and tools    Baseline MC's flexion 65-80 with pain and tightness, PIP flexion 90 to 95    Time 5    Period Weeks    Status New    Target Date 11/04/20      OT LONG TERM GOAL #2   Title R hand incision healt and scar improve for pt to grip objects and use hand in ADL's and work tools without increase tenderness or pain or symptoms less than 2/10    Baseline pain increase 8/10 scar , and PROM - and not able grip any objects- incision open    Time 8    Period Weeks    Status New    Target Date 11/25/20      OT LONG TERM  GOAL #3   Title R wrist AROM and strength increase to [facility] and same as prior to surgery to return to work    Baseline CTS - healed but tight -and decrease flexion 62, ext 54 - but had scaphoid fx with ORIF in past and did not had normal AROM - not using hand in any ADL's or IADL's yet    Time 8    Period Weeks    Status New    Target Date 11/25/20      OT LONG TERM GOAL #4   Title R  grip and prehension strength increase to more than 75% compare to L hand to grip tools and  utenciles and return to prior level of function    Baseline incision still open- stiffness and edema in digits- decrease flexion and extention of dgiits    Time 8    Period Weeks    Status New    Target Date 11/25/20                   Plan - 10/07/20 1112     Clinical Impression Statement Pt is 4 1/2 wks s/p R hand endoscopy CTR and dupuytrens release of palm and 3rd digit - pt with incision  healing very well - 1 cm area still not close all the way - pt keep covered -and doing dressing changes per surgeon instruction - pt show  since last week increase extnetion of digits , increase flexion but mostly decrease in MC's flexion , increase edema/ pain  in R hand , scar tissue and open incision- CTR scar closed - but pt with decrease wrist ext and flexion -but per pt had decrease ROM because of old ORIF for scaphoid fx years ago -pt ed on scar mobs to scars that is closed- pt cont to show decrease functional use of R dominant hand in ADL's and IADL's - pt occupation is Personnel officer - ed on modifications and joint protection in future    OT Occupational Profile and History Problem Focused Assessment - Including review of records relating to presenting problem    Occupational performance deficits (Please refer to evaluation for details): ADL's;IADL's;Work;Play;Leisure;Social Participation;Rest and Sleep    Body Structure / Function / Physical Skills ADL;Decreased knowledge of precautions;Coordination;Edema;Dexterity;Decreased knowledge of use of DME;Strength;Pain;UE functional use;ROM;IADL;Scar mobility;Skin integrity;FMC;Flexibility    Rehab Potential Good    Clinical Decision Making Limited treatment options, no task modification necessary    Comorbidities Affecting Occupational Performance: None    Modification or Assistance to Complete Evaluation  No modification of tasks or assist necessary to complete eval    OT Frequency 2x / week    OT Duration 8 weeks    OT Treatment/Interventions  Self-care/ADL training;Paraffin;Fluidtherapy;Contrast Bath;Therapeutic exercise;Manual Therapy;DME and/or AE instruction;Splinting;Therapeutic activities;Scar mobilization;Passive range of motion;Patient/family education    Consulted and Agree with Plan of Care Patient             Patient will benefit from skilled therapeutic intervention in order to improve the following deficits and impairments:   Body Structure / Function / Physical Skills: ADL, Decreased knowledge of precautions, Coordination, Edema, Dexterity, Decreased knowledge of use of DME, Strength, Pain, UE functional use, ROM, IADL, Scar mobility, Skin integrity, FMC, Flexibility       Visit Diagnosis: Scar condition and fibrosis of skin  Stiffness of right hand, not elsewhere classified  Stiffness of right wrist, not elsewhere classified  Pain in right hand  Muscle weakness (generalized)    Problem List  Patient Active Problem List   Diagnosis Date Noted   Urticaria 11/15/2014    Oletta Cohn OTR/L,CLT 10/07/2020, 11:59 AM  [facility] [address] [place], [address] Phone: [phone]   Fax:  [phone]  Name: Paul Contreras MRN: [id number] Date of Birth: Aug 30, 1980

## 2020-10-14 ENCOUNTER — Ambulatory Visit: Payer: 59 | Admitting: Occupational Therapy

## 2020-10-14 DIAGNOSIS — M79641 Pain in right hand: Secondary | ICD-10-CM

## 2020-10-14 DIAGNOSIS — L905 Scar conditions and fibrosis of skin: Secondary | ICD-10-CM

## 2020-10-14 DIAGNOSIS — M6281 Muscle weakness (generalized): Secondary | ICD-10-CM

## 2020-10-14 DIAGNOSIS — M25631 Stiffness of right wrist, not elsewhere classified: Secondary | ICD-10-CM

## 2020-10-14 DIAGNOSIS — M25641 Stiffness of right hand, not elsewhere classified: Secondary | ICD-10-CM

## 2020-10-14 NOTE — Therapy (Signed)
[facility] [address] [place], [place], [address] Phone: [phone]   Fax:  [phone]  Occupational Therapy Treatment  Patient Details  Name: Paul Contreras MRN: [id number] Date of Birth: 10-26-1980 Referring Provider (OT): Dr Joice LoftsPoggi   Encounter Date: 10/14/2020   OT End of Session - 10/14/20 1203     Visit Number 3    Number of Visits 14    Date for OT Re-Evaluation 11/25/20    OT Start Time 1116    OT Stop Time 1159    OT Time Calculation (min) 43 min    Activity Tolerance Patient tolerated treatment well    Behavior During Therapy [facility] for tasks assessed/performed             Past Medical History:  Diagnosis Date   Anxiety    Arthritis    Complication of anesthesia    nausea   Fainting spell 11/15/2014   History of chicken pox    Kawasaki disease (HCC)    PONV (postoperative nausea and vomiting)    Umbilical hernia     Past Surgical History:  Procedure Laterality Date   CYST REMOVAL HAND Left    arm    dog bite  2014   right arm   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   Subjective Assessment - 10/14/20 1201     Subjective  I am seeing the Dr tomorrow- I forgot to tell you he took things off my knuckles too - swelling still over my knuckles- that scar is close to healing in my palm - scar still thick - did not get Vit E until about 3 days ago    Pertinent History Dr Joice LoftsPoggi on 09/03/20 done  Endoscopic right carpal tunnel release.    2. Excision of Dupuytren's contracture,  right long finger.  3. Excision of soft tissue masses over dorsum of right index, long, and ring PIP joints.   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 Yes    Pain Score 4     Pain Location Hand    Pain Orientation Right    Pain Descriptors / Indicators Aching;Tightness    Pain Type Surgical pain    Pain Onset More than a month ago    Pain Frequency Intermittent                [facility] OT Assessment - 10/14/20 0001       Right Hand AROM   R Index  MCP 0-90 75 Degrees    R Index PIP 0-100 100 Degrees    R Long  MCP 0-90 80 Degrees    R Long PIP 0-100 100 Degrees    R Ring  MCP 0-90 80 Degrees    R Ring PIP 0-100  100 Degrees    R Little  MCP 0-90 90 Degrees    R Little PIP 0-100 100 Degrees                Pt show decrease extention of 4th digit but increase flexion - cont to be mostly limited in MC flexion - scar still healing- still thick 1 cm area that is healing Pt ed on scar massage to CTS, proximal and distal part of palmar scar - importance to prevent flexor contracture And can use cica scar pad on CTS and distal scar at night time Fitted with isotoner glove to use night time and during day some Padding provided for splint - pressure relieve over dorsal PIP's -and new straps                 Pt ed on cont cont Cont rolling for extention of digits over red roller - 20 reps And then AAROM on table digits 10 reps   Tendon glides - and composite fist to palm  pain  And AROM - not force - he was forcing Opposition to all digits  Wrist ext and flexion AAROM - pain free - light stretch at wrist  cont with dorsal hand base extention splint for night time use - for 3rd thru 5th digit               OT Treatments/Exercises (OP) - 10/14/20 0001       Moist Heat Therapy   Number  Minutes Moist Heat 5 Minutes    Moist Heat Location Hand;Wrist   prior to scar mobs and ROM                   OT Education - 10/14/20 1203     Education Details progress and changes to HEP    Person(s) Educated Patient    Methods Demonstration;Explanation;Tactile cues;Verbal cues;Handout    Comprehension Verbal cues required;Returned demonstration;Verbalized understanding              OT Short Term Goals - 09/30/20 1833       OT SHORT TERM GOAL #1   Title Pt wear and tolerate night extention splint for Exention of R digits improve to [facility] to be able to wash face and donn gloves    Baseline decrease -15 3rd thru 5th PIP's    Time 4    Period Weeks    Status New    Target Date 10/28/20               OT Long Term Goals - 09/30/20 1834       OT LONG TERM GOAL #1   Title R  hand  incision heal , and dscar tissue and edema decrease for pt to show increase AROM to make tight fist to grip utenciles and tools    Baseline MC's flexion 65-80 with pain and tightness, PIP flexion 90 to 95    Time 5    Period Weeks    Status New    Target Date 11/04/20      OT LONG TERM GOAL #2   Title R hand incision healt and scar improve for pt to grip objects and use hand in ADL's and work tools without increase tenderness or pain or symptoms less than 2/10    Baseline pain increase 8/10 scar , and PROM - and not able grip any objects- incision open    Time 8    Period Weeks    Status  New    Target Date 11/25/20      OT LONG TERM GOAL #3   Title R wrist AROM and strength increase to [facility] and same as prior to surgery to return to work    Baseline CTS - healed but tight -and decrease flexion 62, ext 54 - but had scaphoid fx with ORIF in past and did not had normal AROM - not using hand in any ADL's or IADL's yet    Time 8    Period Weeks    Status New    Target Date 11/25/20      OT LONG TERM GOAL #4   Title R  grip and prehension strength increase to more than 75% compare  to L hand to grip tools and utenciles and return to prior level of function    Baseline incision still open- stiffness and edema in digits- decrease flexion and extention of dgiits    Time 8    Period Weeks    Status New    Target Date 11/25/20                   Plan - 10/14/20 1204     Clinical Impression Statement Pt is  tomorrow 6 wks s/p R hand endoscopy CTR and dupuytrens release of palm and 3rd digit - pt with incision  healing very well - 1 cm area o thick scar - not able to do scar mobs yet - Pt show increase flexor contracture at 4th digit this date -reinforce importance of scar mobs , night time splinting and ROM - adjusted  this date night extention splint -and review scar mobs - pt show increase flexion of digits - but cont to have some edema over MC's and palm as well as thick scar tissue - per pt had decrease ROM  at wrist before because of old ORIF for scaphoid fx years ago -pt ed on scar mobs to scars that is closed- pt cont to show decrease functional use of R dominant hand in ADL's and IADL's - pt occupation is Personnel officer - ed on modifications and joint protection in future    OT Occupational Profile and History Problem Focused Assessment - Including review of records relating to presenting problem    Occupational performance deficits (Please refer to evaluation for details): ADL's;IADL's;Work;Play;Leisure;Social Participation;Rest and Sleep    Body Structure / Function / Physical Skills ADL;Decreased knowledge of precautions;Coordination;Edema;Dexterity;Decreased knowledge of use of DME;Strength;Pain;UE functional use;ROM;IADL;Scar mobility;Skin integrity;FMC;Flexibility    Rehab Potential Good    Clinical Decision Making Limited treatment options, no task modification necessary    Comorbidities Affecting Occupational Performance: None    Modification or Assistance to Complete Evaluation  No modification of tasks or assist necessary to complete eval    OT Frequency 2x  / week    OT Duration 8 weeks    OT Treatment/Interventions Self-care/ADL training;Paraffin;Fluidtherapy;Contrast Bath;Therapeutic exercise;Manual Therapy;DME and/or AE instruction;Splinting;Therapeutic activities;Scar mobilization;Passive range of motion;Patient/family education    Consulted and Agree with Plan of Care Patient             Patient will benefit from skilled therapeutic intervention in order to improve the following deficits and impairments:   Body Structure / Function / Physical Skills: ADL, Decreased knowledge of precautions, Coordination, Edema, Dexterity, Decreased knowledge of use of DME, Strength, Pain, UE functional use, ROM, IADL, Scar mobility, Skin integrity, FMC, Flexibility       Visit Diagnosis: Scar condition and fibrosis of skin  Stiffness of right  hand, not elsewhere classified  Stiffness of right wrist, not elsewhere classified  Pain in right hand  Muscle weakness (generalized)    Problem List Patient Active Problem List   Diagnosis Date Noted   Urticaria 11/15/2014    Oletta Cohn 10/14/2020, 12:09 PM  [facility] [address] [place], [address] Phone: [phone]   Fax:  [phone]  Name: PRANSHU LYSTER MRN: [id number] Date of Birth: 11/24/1980

## 2020-10-16 ENCOUNTER — Other Ambulatory Visit: Payer: Self-pay

## 2020-10-16 ENCOUNTER — Ambulatory Visit: Payer: 59 | Admitting: Occupational Therapy

## 2020-10-16 DIAGNOSIS — M79641 Pain in right hand: Secondary | ICD-10-CM

## 2020-10-16 DIAGNOSIS — L905 Scar conditions and fibrosis of skin: Secondary | ICD-10-CM | POA: Diagnosis not present

## 2020-10-16 DIAGNOSIS — M6281 Muscle weakness (generalized): Secondary | ICD-10-CM

## 2020-10-16 DIAGNOSIS — M25641 Stiffness of right hand, not elsewhere classified: Secondary | ICD-10-CM

## 2020-10-16 DIAGNOSIS — M25631 Stiffness of right wrist, not elsewhere classified: Secondary | ICD-10-CM

## 2020-10-16 NOTE — Therapy (Signed)
[place] [facility] [address] [place], [address] Phone: [phone]   Fax:  [phone]  Occupational Therapy Treatment  Patient Details  Name: Paul Contreras MRN: [id number] Date of Birth: 1980-08-22 Referring Provider (OT): Dr Joice Lofts   Encounter Date: 10/16/2020   OT End of Session - 10/16/20 1126     Visit Number 4    Number of Visits 14    Date for OT Re-Evaluation 11/25/20    OT Start Time 1115    OT Stop Time 1145    OT Time Calculation (min) 30 min    Activity Tolerance Patient tolerated treatment well    Behavior During Therapy [facility] for tasks assessed/performed             Past Medical History:  Diagnosis Date   Anxiety    Arthritis    Complication of anesthesia    nausea   Fainting spell 11/15/2014   History of chicken pox    Kawasaki disease (HCC)    PONV (postoperative nausea and vomiting)    Umbilical hernia     Past Surgical History:  Procedure Laterality Date   CYST REMOVAL HAND Left    arm    dog bite  2014   right arm   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   Subjective Assessment - 10/16/20 1124     Pertinent History Dr Joice Lofts on 09/03/20 done  Endoscopic right carpal tunnel release.    2. Excision of Dupuytren's contracture, right long finger.  3. Excision of soft tissue masses over dorsum of right index, long, and ring PIP joints.   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 Yes    Pain Score 3     Pain Orientation Right    Pain Descriptors / Indicators Aching;Tightness    Pain Type Surgical pain    Pain Onset More than a month ago    Aggravating Factors  gripping                [facility] OT Assessment - 10/16/20 0001       Right Hand AROM   R Index  MCP 0-90 75 Degrees    R Index PIP 0-100 100 Degrees    R Long  MCP 0-90 80 Degrees    R Long PIP 0-100 100 Degrees   0   R Ring  MCP 0-90 75 Degrees    R Ring PIP 0-100 100 Degrees   0   R Little  MCP 0-90 90 Degrees    R Little PIP 0-100 95 Degrees                      OT Treatments/Exercises (OP) - 10/16/20 0001  RUE Contrast Bath   Time 8 minutes    Comments prior to ROM - scar  massage                Pt show improvement in extention of 4th digit and increase  flexion - cont to be mostly limited in MC flexion - scar still healing- still thick 1 cm area that is healing Pt ed on scar massage to CTS, proximal and distal part of palmar scar - importance to prevent flexor contracture And can use cica scar pad on CTS and distal scar at night time Provided one to use if 1 cm area improve  Cont with isotoner glove to use night time and during day some Padding provided for splint - pressure relieve over dorsal PIP's -and new straps last time                 Pt ed on cont rolling for extention of digits over red roller - 20 reps And then AAROM on table digits 10 reps   Tendon glides - and composite fist to palm  pain free And AROM - not force - he was forcing Opposition to all digits  Wrist ext and flexion AAROM - pain free - light stretch at wrist  cont with dorsal hand base extention splint for night time use - for 3rd thru 5th digit      OT Education - 10/16/20 1125     Education Details progress and changes to HEP    Person(s) Educated Patient    Methods  Demonstration;Explanation;Tactile cues;Verbal cues;Handout    Comprehension Verbal cues required;Returned demonstration;Verbalized understanding              OT Short Term Goals - 09/30/20 1833       OT SHORT TERM GOAL #1   Title Pt wear and tolerate night extention splint for Exention of R digits improve to [facility] to be able to wash face and donn gloves    Baseline decrease -15 3rd thru 5th PIP's    Time 4    Period Weeks    Status New    Target Date 10/28/20               OT Long Term Goals - 09/30/20 1834       OT LONG TERM GOAL #1   Title R  hand  incision heal , and dscar tissue and edema decrease for pt to show increase AROM to make tight fist to grip utenciles and tools    Baseline MC's flexion 65-80 with pain and tightness, PIP flexion 90 to 95    Time 5    Period Weeks    Status New    Target Date 11/04/20      OT LONG TERM GOAL #2   Title R hand incision healt and scar improve for pt to grip objects and use hand in ADL's and work tools without increase tenderness or pain or symptoms less than 2/10    Baseline pain increase 8/10 scar , and PROM - and not able grip any objects- incision open    Time 8    Period Weeks    Status New    Target Date 11/25/20      OT LONG TERM GOAL #3   Title R wrist AROM and strength increase to [facility] and same as prior to surgery to return to work    Baseline CTS - healed but tight -and decrease flexion 62, ext 54 - but  had scaphoid fx with ORIF in past and did not had normal AROM - not using hand in any ADL's or IADL's yet    Time 8    Period Weeks    Status New    Target Date 11/25/20      OT LONG TERM GOAL #4   Title R  grip and prehension strength increase to more than 75% compare to L hand to grip tools and utenciles and return to prior level of function    Baseline incision still open- stiffness and edema in digits- decrease flexion and extention of dgiits    Time 8    Period Weeks    Status New    Target Date 11/25/20                    Plan - 10/16/20 1127     Clinical Impression Statement Pt is about 6 wks s/p R hand endoscopy CTR and dupuytrens release of palm and 3rd digit - pt with incision  healing very well - 1 cm area of thick scar - not able to do scar mobs yet - Pt show increase extention at 4th digit this date compare to last time -reinforce importance of scar mobs , night time splinting and ROM - adjusted  last time night extention splint -and review scar mobs - pt show increase flexion of digits - but cont to have some edema over MC's and palm as well as thick scar tissue - per pt had decrease ROM  at wrist before because of old ORIF for scaphoid fx years ago -pt ed on scar mobs to scars that is closed- pt cont to show decrease functional use of R dominant hand in ADL's and IADL's - pt occupation is Personnel officer - ed on modifications and joint protection in future    OT Occupational Profile and History Problem Focused Assessment - Including review of records relating to presenting problem    Occupational performance deficits (Please refer to evaluation for details): ADL's;IADL's;Work;Play;Leisure;Social Participation;Rest and Sleep    Body Structure / Function / Physical Skills ADL;Decreased knowledge of precautions;Coordination;Edema;Dexterity;Decreased knowledge of use of DME;Strength;Pain;UE functional use;ROM;IADL;Scar mobility;Skin integrity;FMC;Flexibility    Rehab Potential Good    Clinical Decision Making Limited treatment options, no task modification necessary    Comorbidities Affecting Occupational Performance: None    Modification or Assistance to Complete Evaluation  No modification of tasks or assist necessary to complete eval    OT Duration 8 weeks    OT Treatment/Interventions Self-care/ADL training;Paraffin;Fluidtherapy;Contrast Bath;Therapeutic exercise;Manual Therapy;DME and/or AE instruction;Splinting;Therapeutic activities;Scar mobilization;Passive range of  motion;Patient/family education    Consulted and Agree with Plan of Care Patient             Patient will benefit from skilled therapeutic intervention in order to improve the following deficits and impairments:   Body Structure / Function / Physical Skills: ADL, Decreased knowledge of precautions, Coordination, Edema, Dexterity, Decreased knowledge of use of DME, Strength, Pain, UE functional use, ROM, IADL, Scar mobility, Skin integrity, FMC, Flexibility       Visit Diagnosis: Scar condition and fibrosis of skin  Stiffness of right hand, not elsewhere classified  Stiffness of right wrist, not elsewhere classified  Pain in right hand  Muscle weakness (generalized)    Problem List Patient Active Problem List   Diagnosis Date Noted   Urticaria 11/15/2014    Oletta Cohn OTR/L,CLT 10/16/2020, 12:14 PM  [place] [facility] [address] [place], [place], [address] Phone: [phone]   Fax:  [phone]  Name: Paul Contreras MRN: [id number] Date of Birth: 10-27-1980

## 2020-10-21 ENCOUNTER — Ambulatory Visit: Payer: 59 | Admitting: Occupational Therapy

## 2020-10-23 ENCOUNTER — Ambulatory Visit: Payer: 59 | Admitting: Occupational Therapy

## 2020-10-23 DIAGNOSIS — M25631 Stiffness of right wrist, not elsewhere classified: Secondary | ICD-10-CM

## 2020-10-23 DIAGNOSIS — L905 Scar conditions and fibrosis of skin: Secondary | ICD-10-CM

## 2020-10-23 DIAGNOSIS — M25641 Stiffness of right hand, not elsewhere classified: Secondary | ICD-10-CM

## 2020-10-23 DIAGNOSIS — M79641 Pain in right hand: Secondary | ICD-10-CM

## 2020-10-23 DIAGNOSIS — M6281 Muscle weakness (generalized): Secondary | ICD-10-CM

## 2020-10-23 NOTE — Therapy (Signed)
[place] [facility] [address] [place], [place], [address] Phone: [phone]   Fax:  [phone]  Occupational Therapy Treatment  Patient Details  Name: Paul Contreras MRN: [id number] Date of Birth: 05-Jan-1981 Referring Provider (OT): Dr Joice LoftsPoggi   Encounter Date: 10/23/2020   OT End of Session - 10/23/20 1216     Visit Number 5    Number of Visits 14    Date for OT Re-Evaluation 11/25/20    OT Start Time 1119    OT Stop Time 1212    OT Time Calculation (min) 53 min    Activity Tolerance Patient tolerated treatment well    Behavior [facility] for tasks assessed/performed             Past Medical History:  Diagnosis Date   Anxiety    Arthritis    Complication of anesthesia    nausea   Fainting spell 11/15/2014   History of chicken pox    Kawasaki disease (HCC)    PONV (postoperative nausea and vomiting)    Umbilical hernia     Past Surgical History:  Procedure Laterality Date   CYST REMOVAL HAND Left    arm    dog bite  2014   right arm   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   Subjective Assessment - 10/23/20 1150     Subjective  I seen the Dr and I am doing all the exercises and massage - but my swelling is still over the knuckle and making fist is really stiff    Pertinent History Dr Joice LoftsPoggi on 09/03/20 done  Endoscopic right carpal tunnel release.    2. Excision of Dupuytren's contracture, right long finger.  3. Excision of soft tissue masses over dorsum of right index, long,  and ring PIP joints.   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 Yes    Pain Score 4     Pain Location Hand    Pain Orientation Right    Pain Descriptors / Indicators Aching;Tightness;Tender    Pain Type Surgical pain    Pain Onset More than a month ago    Pain Frequency Intermittent                [facility] OT Assessment - 10/23/20 0001       Strength   Right Hand Grip (lbs) 50    Right Hand Lateral Pinch 17 lbs    Right Hand 3 Point Pinch 16 lbs    Left Hand Grip (lbs) 100    Left Hand Lateral Pinch 23 lbs    Left Hand 3 Point Pinch 22 lbs      Right Hand AROM   R Index  MCP 0-90 80 Degrees    R Index PIP  0-100 100 Degrees    R Long  MCP 0-90 75 Degrees    R Long PIP 0-100 100 Degrees    R Ring  MCP 0-90 80 Degrees    R Ring PIP 0-100 100 Degrees    R Little  MCP 0-90 90 Degrees    R Little PIP 0-100 95 Degrees                      OT Treatments/Exercises (OP) - 10/23/20 0001       RUE Fluidotherapy   Number Minutes Fluidotherapy 15 Minutes    RUE Fluidotherapy Location Hand;Wrist    Comments prior to ROM and scar tissue mobs      RUE Contrast Bath   Time --             Pt show improvement in extention of 4th digit and increase  flexion - cont to be mostly limited in MC flexion  and composite - delay healing at scar in [facility] but closed this date  Pt re ed on scar massage to palmar scar  and CT scar - importance to prevent flexor contracture And can use cica scar pad  now on both scars with tubigrip or isotoner glove  Cont with isotoner glove to use night time and during day as needed Pt can hold off on extention splint use for night time                  Pt ed on cont rolling for extention of digits over red roller - 20 reps - had to review again And then AAROM on  table digits 10 reps   Tendon glides - and composite fist to palm  pain free And AROM - not force - he was forcing - again REVIEW Opposition to all digits  Wrist ext and flexion AAROM - pain free - light stretch at wrist         OT Education - 10/23/20 1216     Education Details progress and changes to HEP    Person(s) Educated Patient    Methods Demonstration;Explanation;Tactile cues;Verbal cues;Handout    Comprehension Verbal cues required;Returned demonstration;Verbalized understanding              OT Short Term Goals - 10/23/20 1222       OT SHORT TERM GOAL #1   Title Pt wear and tolerate night extention splint for Exention of R digits improve to [facility] to be able to wash face and donn gloves    Status Achieved               OT Long Term Goals - 10/23/20 1222       OT LONG TERM GOAL #1   Title R  hand  incision heal , and dscar tissue and edema decrease for pt to show increase AROM to make tight fist to grip utenciles and tools    Baseline MC's flexion 65-80 with pain and tightness, PIP flexion 90 to 95- improving but not able to touch palm or make tight fist    Time 2    Period Weeks    Status On-going    Target Date 11/04/20      OT LONG TERM GOAL #2   Title R hand incision healt and scar improve for pt to grip objects and use hand in ADL's and work tools without increase tenderness or pain or symptoms less than 2/10    Baseline pain increase 8/10 scar ,  and PROM - and not able grip any objects- incision open- scar in middle still tender this date -and thick - delayed healing - cannot grip objects or tight grip - composite fist pain 4-5/10    Time 4    Period Weeks    Status On-going    Target Date 11/25/20      OT LONG TERM GOAL #3   Title R wrist AROM and strength increase to [facility] 2 and same as prior to surgery to return to work    Status Achieved      OT LONG TERM GOAL #4   Title R  grip and prehension strength increase to more than 75% compare to L  hand to grip tools and utenciles and return to prior level of function    Baseline incision still open- stiffness and edema in digits- decrease flexion and extention of dgiits- 6/30/2 - assess today - pain with grip -grip R 50 L 100- 3 point grip decrease    Time 4    Period Weeks    Status On-going    Target Date 11/25/20                   Plan - 10/23/20 1217     Clinical Impression Statement Pt is about 7 wks s/p R hand endoscopy CTR and dupuytrens release of palm and 3rd digit - pt with incision  healing very well - 1 cm area of thick scar - initiated this date scar tissue massage- pt was pushing to hard on it the last few days and wearing night extention splint at night causing to much stiffness- pt to discharge night splint temporary until next visit - and work on light massage, cica scar pad for night time , AROM - and no tight or force full grip - keep pain under 2/10  to decrease scar tissue, edema, pain and increase composite fist  - Pt show increase extention at  all digits - if pt can maintain his extention - he can discharge night extention splint - pt cont to have edema over MC's and palm as well as thick scar tissue at [facility] - per pt had decrease ROM  at wrist before because of old ORIF for scaphoid fx years ago -pt reed on scar mobs and cica scar pad for whole palmar scar- pt cont to show decrease functional use of R dominant hand in ADL's and IADL's - pt occupation is Personnel officer - ed on modifications and joint protection in future    OT Occupational Profile and History Problem Focused Assessment - Including review of records relating to presenting problem    Occupational performance deficits (Please refer to evaluation for details): ADL's;IADL's;Work;Play;Leisure;Social Participation;Rest and Sleep    Body Structure / Function / Physical Skills ADL;Decreased knowledge of precautions;Coordination;Edema;Dexterity;Decreased knowledge of use of DME;Strength;Pain;UE functional  use;ROM;IADL;Scar mobility;Skin integrity;FMC;Flexibility    Rehab Potential Good    Clinical Decision Making Limited treatment options, no task modification necessary    Comorbidities Affecting Occupational Performance: None    Modification or Assistance to Complete Evaluation  No modification of tasks or assist necessary to complete eval    OT Frequency 1x / week    OT Duration 8 weeks    OT Treatment/Interventions Self-care/ADL training;Paraffin;Fluidtherapy;Contrast Bath;Therapeutic exercise;Manual Therapy;DME and/or AE instruction;Splinting;Therapeutic activities;Scar mobilization;Passive range of motion;Patient/family education    Consulted and Agree with Plan of Care Patient             Patient will benefit from skilled therapeutic  intervention in order to improve the following deficits and impairments:   Body Structure / Function / Physical Skills: ADL, Decreased knowledge of precautions, Coordination, Edema, Dexterity, Decreased knowledge of use of DME, Strength, Pain, UE functional use, ROM, IADL, Scar mobility, Skin integrity, FMC, Flexibility       Visit Diagnosis: Scar condition and fibrosis of skin  Stiffness of right hand, not elsewhere classified  Stiffness of right wrist, not elsewhere classified  Pain in right hand  Muscle weakness (generalized)    Problem List Patient Active Problem List   Diagnosis Date Noted   Urticaria 11/15/2014    Oletta Cohn OTR/L,CLT 10/23/2020, 12:26 PM  [facility] [address], [place], [address] Phone: [phone]   Fax:  [phone]  Name: Paul Contreras MRN: [id number] Date of Birth: February 23, 1981

## 2020-10-24 ENCOUNTER — Encounter: Payer: 59 | Admitting: Occupational Therapy

## 2020-10-30 ENCOUNTER — Other Ambulatory Visit: Payer: Self-pay

## 2020-10-30 ENCOUNTER — Ambulatory Visit: Payer: 59 | Attending: Student | Admitting: Occupational Therapy

## 2020-10-30 DIAGNOSIS — M25641 Stiffness of right hand, not elsewhere classified: Secondary | ICD-10-CM | POA: Insufficient documentation

## 2020-10-30 DIAGNOSIS — M25631 Stiffness of right wrist, not elsewhere classified: Secondary | ICD-10-CM | POA: Insufficient documentation

## 2020-10-30 DIAGNOSIS — M6281 Muscle weakness (generalized): Secondary | ICD-10-CM | POA: Insufficient documentation

## 2020-10-30 DIAGNOSIS — L905 Scar conditions and fibrosis of skin: Secondary | ICD-10-CM | POA: Diagnosis present

## 2020-10-30 DIAGNOSIS — M79641 Pain in right hand: Secondary | ICD-10-CM

## 2020-10-30 NOTE — Therapy (Signed)
[facility] [address] [place], [address] Phone: [phone]   Fax:  [phone]  Occupational Therapy Treatment  Patient Details  Name: Paul Contreras MRN: [id number] Date of Birth: 12/24/1980 Referring Provider (OT): Dr Joice Lofts   Encounter Date: 10/30/2020   OT End of Session - 10/30/20 0858     Visit Number 6    Number of Visits 14    Date for OT Re-Evaluation 11/25/20    OT Start Time 0815    OT Stop Time 0857    OT Time Calculation (min) 42 min    Activity Tolerance Patient tolerated treatment well    Behavior During Therapy [facility] for tasks assessed/performed             Past Medical History:  Diagnosis Date   Anxiety    Arthritis    Complication of anesthesia    nausea   Fainting spell 11/15/2014   History of chicken pox    Kawasaki disease (HCC)    PONV (postoperative nausea and vomiting)    Umbilical hernia     Past Surgical History:  Procedure Laterality Date   CYST REMOVAL HAND Left    arm    dog bite  2014   right arm   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   Subjective Assessment - 10/30/20 0819     Subjective  Feels better with the night splint off- but middle finger still some swelling- did over do somethings yesterday and had some  last night - but has been using it more    Pertinent History Dr Joice Lofts on 09/03/20 done  Endoscopic right carpal tunnel release.    2. Excision of Dupuytren's contracture, right long finger.  3. Excision of soft tissue masses  over dorsum of right index, long, and ring PIP joints.   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 Yes    Pain Score 2     Pain Location Hand    Pain Orientation Right    Pain Descriptors / Indicators Tightness    Pain Type Surgical pain    Pain Onset More than a month ago                [facility] OT Assessment - 10/30/20 0001       AROM   Right Wrist Extension 55 Degrees    Right Wrist Flexion 70 Degrees      Strength   Right Hand Grip (lbs) 52    Right Hand Lateral Pinch 23 lbs    Right Hand 3 Point Pinch 22 lbs    Left Hand Grip (lbs) 100    Left Hand Lateral Pinch 23 lbs    Left Hand 3 Point Pinch 22 lbs  Right Hand AROM   R Index  MCP 0-90 80 Degrees    R Long  MCP 0-90 75 Degrees    R Ring  MCP 0-90 80 Degrees    R Little  MCP 0-90 90 Degrees              Report increase motion since last time- and pain decrease          OT Treatments/Exercises (OP) - 10/30/20 0001       RUE Fluidotherapy   Number Minutes Fluidotherapy 8 Minutes    RUE Fluidotherapy Location Hand;Wrist    Comments Prior to ROM for wrist and digits             Pt maintaining extention of 3rd and 4th digit and increase  flexion - cont to be mostly limited in MC flexion  and composite - delay healing at scar in [facility] -pt focusing now on scar massage  Pt re ed on scar massage to palmar scar  and CT scar - importance to prevent flexor contracture And can use cica scar pad  with tubigrip or isotoner glove  Cont with isotoner glove to use night time and during day as needed Pt can hold off on extention splint use for night time  Mom does essential oil - ask if she can mix him something for inflammation over MC's and 3rd digit                  Pt rolling for extention of digits over red roller - 20  reps  And then AAROM extention on table digits 10 reps   Tendon glides - and composite fist to palm  pain free And AROM - not force - he was forcing - again REVIEW Opposition to all digits  Wrist ext and flexion AAROM - pain free - light stretch at wrist         OT Education - 10/30/20 0858     Education Details progress and changes to HEP    Person(s) Educated Patient    Methods Demonstration;Explanation;Tactile cues;Verbal cues;Handout    Comprehension Verbal cues required;Returned demonstration;Verbalized understanding              OT Short Term Goals - 10/23/20 1222       OT SHORT TERM GOAL #1   Title Pt wear and tolerate night extention splint for Exention of R digits improve to [facility] to be able to wash face and donn gloves    Status Achieved               OT Long Term Goals - 10/23/20 1222       OT LONG TERM GOAL #1   Title R  hand  incision heal , and dscar tissue and edema decrease for pt to show increase AROM to make tight fist to grip utenciles and tools    Baseline MC's flexion 65-80 with pain and tightness, PIP flexion 90 to 95- improving but not able to touch palm or make tight fist    Time 2    Period Weeks    Status On-going    Target Date 11/04/20      OT LONG TERM GOAL #2   Title R hand incision healt and scar improve for pt to grip objects and use hand in ADL's and work tools without increase tenderness or pain or symptoms less than 2/10    Baseline pain increase 8/10 scar , and PROM - and not able grip  any objects- incision open- scar in middle still tender this date -and thick - delayed healing - cannot grip objects or tight grip - composite fist pain 4-5/10    Time 4    Period Weeks    Status On-going    Target Date 11/25/20      OT LONG TERM GOAL #3   Title R wrist AROM and strength increase to [facility] and same as prior to surgery to return to work    Status Achieved      OT LONG TERM GOAL #4   Title R  grip and prehension strength  increase to more than 75% compare to L hand to grip tools and utenciles and return to prior level of function    Baseline incision still open- stiffness and edema in digits- decrease flexion and extention of dgiits- 6/30/2 - assess today - pain with grip -grip R 50 L 100- 3 point grip decrease    Time 4    Period Weeks    Status On-going    Target Date 11/25/20                   Plan - 10/30/20 0858     Clinical Impression Statement Pt is about 8 wks s/p R hand endoscopy CTR and dupuytrens release of palm and 3rd digit - pt with incision  healing very well - 1 cm area of thick scar  cont but improving-  cont with focus on scar tissue massage- cont to not wear night extention splint - contwith cica scar pad for night time , AROM - and no tight or force full grip - keep pain under 2/10  to decrease scar tissue, edema, pain and increase composite fist  - Pt to cont to maintain his extention -  pt cont to have edema over MC's and palm as well as thick scar tissue at [facility] - per pt had decrease ROM  at wrist before because of old ORIF for scaphoid fx years ago -pt reed on scar mobs and cica scar pad for whole palmar scar- and his mom does essential oil- to ask something for inflammation and edema -  pt cont to show decrease functional use of R dominant hand in ADL's and IADL's - pt occupation is Personnel officer - ed on modifications and joint protection in future    OT Occupational Profile and History Problem Focused Assessment - Including review of records relating to presenting problem    Occupational performance deficits (Please refer to evaluation for details): ADL's;IADL's;Work;Play;Leisure;Social Participation;Rest and Sleep    Body Structure / Function / Physical Skills ADL;Decreased knowledge of precautions;Coordination;Edema;Dexterity;Decreased knowledge of use of DME;Strength;Pain;UE functional use;ROM;IADL;Scar mobility;Skin integrity;FMC;Flexibility    Clinical Decision Making Limited  treatment options, no task modification necessary    Comorbidities Affecting Occupational Performance: None    Modification or Assistance to Complete Evaluation  No modification of tasks or assist necessary to complete eval    OT Frequency 1x / week    OT Duration 8 weeks    OT Treatment/Interventions Self-care/ADL training;Paraffin;Fluidtherapy;Contrast Bath;Therapeutic exercise;Manual Therapy;DME and/or AE instruction;Splinting;Therapeutic activities;Scar mobilization;Passive range of motion;Patient/family education    Consulted and Agree with Plan of Care Patient             Patient will benefit from skilled therapeutic intervention in order to improve the following deficits and impairments:   Body Structure / Function / Physical Skills: ADL, Decreased knowledge of precautions, Coordination, Edema, Dexterity, Decreased knowledge of use of DME, Strength, Pain,  UE functional use, ROM, IADL, Scar mobility, Skin integrity, FMC, Flexibility       Visit Diagnosis: Scar condition and fibrosis of skin  Stiffness of right hand, not elsewhere classified  Stiffness of right wrist, not elsewhere classified  Pain in right hand    Problem List Patient Active Problem List   Diagnosis Date Noted   Urticaria 11/15/2014    Oletta CohnuPreez, Rianna Lukes OTR/L,CLT 10/30/2020, 9:03 AM  [facility] [address] [place], [place], [address] Phone: [phone]   Fax:  [phone]  Name: Paul Contreras MRN: [id number] Date of Birth: 04-04-1981

## 2020-11-04 ENCOUNTER — Ambulatory Visit: Payer: 59 | Admitting: Occupational Therapy

## 2020-11-10 ENCOUNTER — Ambulatory Visit: Payer: 59 | Admitting: Occupational Therapy

## 2020-11-10 DIAGNOSIS — L905 Scar conditions and fibrosis of skin: Secondary | ICD-10-CM | POA: Diagnosis not present

## 2020-11-10 DIAGNOSIS — M25641 Stiffness of right hand, not elsewhere classified: Secondary | ICD-10-CM

## 2020-11-10 DIAGNOSIS — M79641 Pain in right hand: Secondary | ICD-10-CM

## 2020-11-10 DIAGNOSIS — M25631 Stiffness of right wrist, not elsewhere classified: Secondary | ICD-10-CM

## 2020-11-10 DIAGNOSIS — M6281 Muscle weakness (generalized): Secondary | ICD-10-CM

## 2020-11-10 NOTE — Therapy (Signed)
[facility] [address], [place], [address] Phone: [phone]   Fax:  [phone]  Occupational Therapy Treatment  Patient Details  Name: Paul Contreras MRN: [id number] Date of Birth: Jul 18, 1980 Referring Provider (OT): Dr Joice Lofts   Encounter Date: 11/10/2020   OT End of Session - 11/10/20 0918     Visit Number 7    Number of Visits 14    Date for OT Re-Evaluation 11/25/20    OT Start Time 0918    OT Stop Time 0950    OT Time Calculation (min) 32 min    Activity Tolerance Patient tolerated treatment well    Behavior During Therapy [facility] for tasks assessed/performed             Past Medical History:  Diagnosis Date   Anxiety    Arthritis    Complication of anesthesia    nausea   Fainting spell 11/15/2014   History of chicken pox    Kawasaki disease (HCC)    PONV (postoperative nausea and vomiting)    Umbilical hernia     Past Surgical History:  Procedure Laterality Date   CYST REMOVAL HAND Left    arm    dog bite  2014   right arm   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   Subjective Assessment - 11/10/20 0914     Subjective  My daughter had COVID and could not get with my mom to get essential oils - but will try and swing by there today - my hand just tight, stiff and feel swollen and pain with resistance - by job wanted to know time frame about coming back to work    Pertinent History Dr Joice Lofts on 09/03/20 done  Endoscopic right carpal tunnel release.    2. Excision of  Dupuytren's contracture, right long finger.  3. Excision of soft tissue masses over dorsum of right index, long, and ring PIP joints.   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 Yes    Pain Score 3     Pain Location Hand    Pain Descriptors / Indicators Tightness    Pain Type Surgical pain    Pain Onset More than a month ago    Pain Frequency Intermittent                [facility] OT Assessment - 11/10/20 0001       Strength   Right Hand Grip (lbs) 55    Left Hand Grip (lbs) 100      Right Hand AROM   R Index  MCP 0-90 80 Degrees    R Index PIP 0-100 100 Degrees    R Long  MCP 0-90 76 Degrees    R Long PIP 0-100 100 Degrees  R Ring  MCP 0-90 78 Degrees    R Ring PIP 0-100 100 Degrees    R Little  MCP 0-90 90 Degrees    R Little PIP 0-100 100 Degrees             Pt maintaining extention of 3rd and 4th digit and increase  flexion - cont to be mostly limited in MC flexion  and composite - had delay healing at scar in [facility] -pt focusing now on scar massage  Cont scar massage to palmar scar  and CT scar - importance to prevent flexor contracture And can use cica scar pad  with tubigrip or isotoner glove  Cont with isotoner glove to use night time and during day as needed Pt can hold off on extention splint use for night time  Mom does essential oil - ask if she can mix him something for inflammation over MC's and 3rd digit  and use Epson salt  Daughter had COVID did not get with his mom                        OT Treatments/Exercises (OP) - 11/10/20 0001       RUE Fluidotherapy   Number Minutes Fluidotherapy 8 Minutes    RUE Fluidotherapy Location Hand;Wrist    Comments prior to soft tissue and ROM   2 ice inbetween            Pt rolling for extention of digits over red roller -  20 reps And then AAROM extention on table digits 10 reps   Tendon glides - and composite fist to palm  pain free And AROM - not force - he was forcing - again REVIEW Opposition to all digits  Wrist ext and flexion AAROM - pain free - light stretch at wrist        OT Education - 11/10/20 0915     Education Details progress and changes to HEP    Person(s) Educated Patient    Methods Demonstration;Explanation;Tactile cues;Verbal cues;Handout    Comprehension Verbal cues required;Returned demonstration;Verbalized understanding              OT Short Term Goals - 10/23/20 1222       OT SHORT TERM GOAL #1   Title Pt wear and tolerate night extention splint for Exention of R digits improve to [facility] to be able to wash face and donn gloves    Status Achieved               OT Long Term Goals - 10/23/20 1222       OT LONG TERM GOAL #1   Title R  hand  incision heal , and dscar tissue and edema decrease for pt to show increase AROM to make tight fist to grip utenciles and tools    Baseline MC's flexion 65-80 with pain and tightness, PIP flexion 90 to 95- improving but not able to touch palm or make tight fist    Time 2    Period Weeks    Status On-going    Target Date 11/04/20      OT LONG TERM GOAL #2   Title R hand incision healt and scar improve for pt to grip objects and use hand in ADL's and work tools without increase tenderness or pain or symptoms less than 2/10    Baseline pain increase 8/10 scar , and PROM - and not able grip any objects- incision open- scar in  middle still tender this date -and thick - delayed healing - cannot grip objects or tight grip - composite fist pain 4-5/10    Time 4    Period Weeks    Status On-going    Target Date 11/25/20      OT LONG TERM GOAL #3   Title R wrist AROM and strength [facility] and same as prior to surgery to return to work    Status Achieved      OT LONG TERM GOAL #4   Title R  grip and prehension strength  increase to more than 75% compare to L hand to grip tools and utenciles and return to prior level of function    Baseline incision still open- stiffness and edema in digits- decrease flexion and extention of dgiits- 6/30/2 - assess today - pain with grip -grip R 50 L 100- 3 point grip decrease    Time 4    Period Weeks    Status On-going    Target Date 11/25/20                   Plan - 11/10/20 0918     Clinical Impression Statement Pt is about 9 1/2  wks s/p R hand endoscopy CTR and dupuytrens release of palm and 3rd digit - pt with incision  healing very well and cont to improve-  cont with focus on scar tissue massage- and use of cica scar pad for night time , AROM - and no tight or force full grip - keep pain under 2/10  to decrease scar tissue, edema, pain and increase composite fist  - Pt to cont to maintain his extention -  pt cont to have edema over MC's  - per pt had decrease ROM in 3rd MC and  ROM  at wrist before because of old ORIF for scaphoid fx years ago -pt reed on scar mobs and cica scar pad for whole palmar scar-Reinforce again with pt to try something over the counter for inflammation - his mom does essential oil- to ask something for inflammation and edema -  pt cont to show decrease functional use of R dominant hand in ADL's and IADL's - pt occupation is Personnel officerelectrician - ed on modifications and joint protection in future    OT Occupational Profile and History Problem Focused Assessment - Including review of records relating to presenting problem    Occupational performance deficits (Please refer to evaluation for details): ADL's;IADL's;Work;Play;Leisure;Social Participation;Rest and Sleep    Body Structure / Function / Physical Skills ADL;Decreased knowledge of precautions;Coordination;Edema;Dexterity;Decreased knowledge of use of DME;Strength;Pain;UE functional use;ROM;IADL;Scar mobility;Skin integrity;FMC;Flexibility    Rehab Potential Good    Clinical Decision Making  Limited treatment options, no task modification necessary    Comorbidities Affecting Occupational Performance: None    Modification or Assistance to Complete Evaluation  No modification of tasks or assist necessary to complete eval    OT Frequency 1x / week    OT Duration 8 weeks    OT Treatment/Interventions Self-care/ADL training;Paraffin;Fluidtherapy;Contrast Bath;Therapeutic exercise;Manual Therapy;DME and/or AE instruction;Splinting;Therapeutic activities;Scar mobilization;Passive range of motion;Patient/family education    Consulted and Agree with Plan of Care Patient             Patient will benefit from skilled therapeutic intervention in order to improve the following deficits and impairments:   Body Structure / Function / Physical Skills: ADL, Decreased knowledge of precautions, Coordination, Edema, Dexterity, Decreased knowledge of use of DME, Strength, Pain, UE functional use,  ROM, IADL, Scar mobility, Skin integrity, FMC, Flexibility       Visit Diagnosis: Scar condition and fibrosis of skin  Stiffness of right hand, not elsewhere classified  Stiffness of right wrist, not elsewhere classified  Pain in right hand  Muscle weakness (generalized)    Problem List Patient Active Problem List   Diagnosis Date Noted   Urticaria 11/15/2014    Oletta Cohn OTR/L,CLT 11/10/2020, 12:34 PM  [facility] [address] [place], [address] Phone: [phone]   Fax:  [phone]  Name: Paul Contreras MRN: [id number] Date of Birth: [birth year]

## 2020-11-17 ENCOUNTER — Ambulatory Visit: Payer: 59 | Admitting: Occupational Therapy

## 2020-11-17 DIAGNOSIS — L905 Scar conditions and fibrosis of skin: Secondary | ICD-10-CM

## 2020-11-17 DIAGNOSIS — M25641 Stiffness of right hand, not elsewhere classified: Secondary | ICD-10-CM

## 2020-11-17 DIAGNOSIS — M79641 Pain in right hand: Secondary | ICD-10-CM

## 2020-11-17 DIAGNOSIS — M25631 Stiffness of right wrist, not elsewhere classified: Secondary | ICD-10-CM

## 2020-11-17 DIAGNOSIS — M6281 Muscle weakness (generalized): Secondary | ICD-10-CM

## 2020-11-17 NOTE — Therapy (Signed)
[place] [facility] [address] [place], [address] Phone: [phone]   Fax:  [phone]  Occupational Therapy Treatment  Patient Details  Name: Paul Contreras MRN: [id number] Date of Birth: 1980-05-06 Referring Provider (OT): Dr Joice Lofts   Encounter Date: 11/17/2020   OT End of Session - 11/17/20 1010     Visit Number 8    Number of Visits 14    Date for OT Re-Evaluation 11/25/20    OT Start Time 0945    OT Stop Time 1028    OT Time Calculation (min) 43 min    Activity Tolerance Patient tolerated treatment well    Behavior During Therapy [facility] for tasks assessed/performed             Past Medical History:  Diagnosis Date   Anxiety    Arthritis    Complication of anesthesia    nausea   Fainting spell 11/15/2014   History of chicken pox    Kawasaki disease (HCC)    PONV (postoperative nausea and vomiting)    Umbilical hernia     Past Surgical History:  Procedure Laterality Date   CYST REMOVAL HAND Left    arm    dog bite  2014   right arm   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   Subjective Assessment - 11/17/20 1003     Subjective  I done like you told me - the epson salt in the water and my mom mixed me some essential oils that I have been using - 3 x day it feels better when I do my exercises - your numbers show it ls better- my hand is always stiff in the am - I do have arthritis they told me    Pertinent History Dr Joice Lofts on 09/03/20 done  Endoscopic right carpal tunnel  release.    2. Excision of Dupuytren's contracture, right long finger.  3. Excision of soft tissue masses over dorsum of right index, long, and ring PIP joints.   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 Yes    Pain Location Hand    Pain Orientation Right    Pain Descriptors / Indicators Aching;Tightness    Pain Type Surgical pain    Pain Onset More than a month ago    Pain Frequency Intermittent                [facility] OT Assessment - 11/17/20 0001       Strength   Right Hand Grip (lbs) 62      Right Hand AROM   R Index  MCP 0-90 80 Degrees    R Index PIP 0-100 100 Degrees    R Long  MCP 0-90 80 Degrees    R Long PIP 0-100  100 Degrees    R Ring  MCP 0-90 80 Degrees    R Ring PIP 0-100 100 Degrees    R Little  MCP 0-90 90 Degrees    R Little PIP 0-100 100 Degrees            Pt show increase MC flexion and composite flexion - cont to complain about 3rd MC stiffness and pain - ? Arthritis per pt in past and in hand  Grip increase to 60lbs this date - L  hand 100 lbs  Prehension about same as L at 22 and 23 lbs           OT Treatments/Exercises (OP) - 11/17/20 0001       RUE Fluidotherapy   Number Minutes Fluidotherapy 8 Minutes    RUE Fluidotherapy Location Hand;Wrist    Comments AROM for digist prior to soft tissue            focus on scar mobs [facility] had delay healing and scar tightest  Mini massager used mostly this date and manual by OT - done some over CT scar - but doing well and over dorsal PIP's of 2nd thru 4th -  Pt to cont with scar massage and cica scar pad for night time   Cont to focus on  rolling for extention of digits over red roller - 20 reps And then AAROM extention on table digits 10 reps   Tendon glides - and composite fist to  palm  pain free And AROM - not forcing Opposition to all digits  Wrist ext and flexion AAROM - pain free - light stretch at wrist  Review modifications at work -if he is release to go back -   Follow up in 2 wks          OT Education - 11/17/20 1010     Education Details progress and changes to HEP    Person(s) Educated Patient    Methods Demonstration;Explanation;Tactile cues;Verbal cues;Handout    Comprehension Verbal cues required;Returned demonstration;Verbalized understanding              OT Short Term Goals - 10/23/20 1222       OT SHORT TERM GOAL #1   Title Pt wear and tolerate night extention splint for Exention of R digits [facility] to [facility] to be able to wash face and donn gloves    Status Achieved               OT Long Term Goals - 10/23/20 1222       OT LONG TERM GOAL #1   Title R  hand  incision heal , and dscar tissue and edema decrease for pt to show increase AROM to make tight fist to grip utenciles and tools    Baseline MC's flexion 65-80 with pain and tightness, PIP flexion 90 to 95- improving but not able to touch palm or make tight fist    Time 2    Period Weeks    Status On-going    Target Date 11/04/20      OT LONG TERM GOAL #2   Title R hand incision healt and scar improve for pt to grip objects and use hand in ADL's and work tools without increase tenderness or pain or symptoms less than 2/10    Baseline pain increase 8/10 scar , and PROM - and not able grip any objects- incision open- scar in middle still tender this date -  and thick - delayed healing - cannot grip objects or tight grip - composite fist pain 4-5/10    Time 4    Period Weeks    Status On-going    Target Date 11/25/20      OT LONG TERM GOAL #3   Title R wrist AROM and strength increase to [facility] and same as prior to surgery to return to work    Status Achieved      OT LONG TERM GOAL #4   Title R  grip and prehension strength increase to more than 75% compare to L hand to  grip tools and utenciles and return to prior level of function    Baseline incision still open- stiffness and edema in digits- decrease flexion and extention of dgiits- 6/30/2 - assess today - pain with grip -grip R 50 L 100- 3 point grip decrease    Time 4    Period Weeks    Status On-going    Target Date 11/25/20                   Plan - 11/17/20 1011     Clinical Impression Statement Pt is about 10 1/2  wks s/p R hand endoscopy CTR and dupuytrens release of palm and 3rd digit - pt with incision  healing very well and cont to improve-  cont with focus on scar tissue massage- and use of cica scar pad for night time , AROM - and no tight or force full grip - keep pain under 2/10  to decrease scar tissue at [facility] , edema, pain and increase composite fist  - Pt to cont to maintain his extention  without night splint - edema did improve this date with pt using some epson salt and essential oils- pt cont to complain of stiffness in the am and in 3rd [facility] - ? history of arthritis and over use of hand in occupation  --Reinforce with pt to cont to use  something over the counter for inflammation - his mom does essential oil- pt this date with increase MC flexion, decrease edema and increase grip strength to 60 lbs  - pt occupation is Personnel officer - ed on modifications and joint protection in future    OT Occupational Profile and History Problem Focused Assessment - Including review of records relating to presenting problem    Occupational performance deficits (Please refer to evaluation for details): ADL's;IADL's;Work;Play;Leisure;Social Participation;Rest and Sleep    Body Structure / Function / Physical Skills ADL;Decreased knowledge of precautions;Coordination;Edema;Dexterity;Decreased knowledge of use of DME;Strength;Pain;UE functional use;ROM;IADL;Scar mobility;Skin integrity;FMC;Flexibility    Rehab Potential Good    Clinical Decision Making Limited treatment options, no task modification  necessary    Comorbidities Affecting Occupational Performance: None    OT Frequency Biweekly    OT Duration 8 weeks    OT Treatment/Interventions Self-care/ADL training;Paraffin;Fluidtherapy;Contrast Bath;Therapeutic exercise;Manual Therapy;DME and/or AE instruction;Splinting;Therapeutic activities;Scar mobilization;Passive range of motion;Patient/family education    Consulted and Agree with Plan of Care Patient             Patient will benefit from skilled therapeutic intervention in order to improve the following deficits and impairments:   Body Structure / Function / Physical Skills: ADL, Decreased knowledge of precautions, Coordination, Edema, Dexterity, Decreased knowledge of use of DME, Strength, Pain, UE functional use, ROM, IADL, Scar mobility, Skin integrity, FMC, Flexibility       Visit Diagnosis: Stiffness of right hand, not elsewhere classified  Stiffness of right wrist, not elsewhere classified  Pain in right hand  Muscle weakness (generalized)  Scar condition and fibrosis of skin    Problem List Patient Active Problem List   Diagnosis Date Noted   Urticaria 11/15/2014    Oletta Cohn OTR/L,CLT 11/17/2020, 11:08 AM  [place] [facility] [address] [place], [address] Phone: [phone]   Fax:  [phone]  Name: Paul Contreras MRN: [id number] Date of Birth: 1980-05-31

## 2020-12-01 ENCOUNTER — Ambulatory Visit: Payer: 59 | Admitting: Occupational Therapy

## 2020-12-08 ENCOUNTER — Ambulatory Visit: Payer: 59 | Attending: Student | Admitting: Occupational Therapy

## 2020-12-08 DIAGNOSIS — M25641 Stiffness of right hand, not elsewhere classified: Secondary | ICD-10-CM | POA: Insufficient documentation

## 2020-12-08 DIAGNOSIS — M6281 Muscle weakness (generalized): Secondary | ICD-10-CM | POA: Insufficient documentation

## 2020-12-08 DIAGNOSIS — L905 Scar conditions and fibrosis of skin: Secondary | ICD-10-CM | POA: Diagnosis present

## 2020-12-08 DIAGNOSIS — M79641 Pain in right hand: Secondary | ICD-10-CM | POA: Insufficient documentation

## 2020-12-08 NOTE — Therapy (Signed)
[place] [facility] [address] [place], [place], [address] Phone: [phone]   Fax:  [phone]  Occupational Therapy Treatment  Patient Details  Name: Paul Contreras MRN: [id number] Date of Birth: 1981-03-14 Referring Provider (OT): Dr Joice LoftsPoggi   Encounter Date: 12/08/2020   OT End of Session - 12/08/20 2018     Visit Number 9    Number of Visits 14    Date for OT Re-Evaluation 01/19/21    OT Start Time 1625    OT Stop Time 1713    OT Time Calculation (min) 48 min    Activity Tolerance Patient tolerated treatment well    Behavior During Therapy [facility] for tasks assessed/performed             Past Medical History:  Diagnosis Date   Anxiety    Arthritis    Complication of anesthesia    nausea   Fainting spell 11/15/2014   History of chicken pox    Kawasaki disease (HCC)    PONV (postoperative nausea and vomiting)    Umbilical hernia     Past Surgical History:  Procedure Laterality Date   CYST REMOVAL HAND Left    arm    dog bite  2014   right arm   DUPUYTREN CONTRACTURE RELEASE Right 09/03/2020   Procedure: ENDOSCOPIC RIGHT CARPAL TUNNEL RELEASE WITH EXCISION OF A DUPUYTREN'S CONTRACTURE OF RIGHT RING FINGER, masses on right indes/long/ring PIP joints;  Surgeon: Christena FlakePoggi, Paul Contreras, Paul Contreras;  Location: [facility] ORS;  Service: Orthopedics;  Laterality: Right;   HERNIA REPAIR  1981   hydrocele repair; pt was 3 months old   WRIST SURGERY Right 1998   has 1 screw in his wrist    There were no vitals filed for this visit.   Subjective Assessment - 12/08/20 2010     Subjective  I seen Dr Joice LoftsPoggi- had been back to work light duty - and I am trying like you said not to abuse my hands using the tools and doing my job- but middle finger and knuckles still swollen and tight -and pain 3-6/10 pain - in the morning ad end of day - need to do my exercises in the morning - they stiff and hurting. My middle finger knuckle hurt prior to  surgery - that is arthritis -now my hand I can lay flat now and Carpal tunnel symptoms better    Pertinent History Dr Joice LoftsPoggi on 09/03/20 done  Endoscopic right carpal tunnel release.    2. Excision of Dupuytren's contracture, right long finger.  3. Excision of soft tissue masses over dorsum of right index, long, and ring PIP joints.   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 Yes    Pain Score 4     Pain Location Hand    Pain Orientation Right    Pain Descriptors / Indicators Aching;Tightness    Pain Type Surgical pain;Chronic pain    Pain Onset More than a month ago    Pain Frequency Constant    Aggravating Factors  in am and making tight fist                [facility] Assessment - 12/08/20 0001       Strength  Right Hand Grip (lbs) 67    Right Hand Lateral Pinch 30 lbs    Right Hand 3 Point Pinch 24 lbs    Left Hand Grip (lbs) 100    Left Hand Lateral Pinch 30 lbs    Left Hand 3 Point Pinch 23 lbs      Right Hand AROM   R Index  MCP 0-90 80 Degrees    R Index PIP 0-100 100 Degrees    R Long  MCP 0-90 80 Degrees    R Long PIP 0-100 100 Degrees    R Ring  MCP 0-90 85 Degrees    R Ring PIP 0-100 100 Degrees    R Little  MCP 0-90 90 Degrees    R Little PIP 0-100 95 Degrees             Pt arrive with reports of pain at end of day working light duty - Personnel officer - 3-4/10 over [employer] - 3rd mostly  And in the am - stiff - and pain 5-6/10 attempting to make fist        Pt to cont to do contrast in am to decrease stiffness and evening decrease pain  Cont scar mobs at palmar scar and over [facility] where pt had delay healing and scar tightest - and use cica scar pad - issued new ones for 3-4 wk  Maintain PIP  extention of digits over red roller - 20 reps - 4th digit PIP  Able to get easy in session     Tendon glides - and composite fist to palm  pain free  Do not force 3rd digit- had issues prior to surgery in 3rd MC flexion ? Arthritis  CTS and dupuytrens release per pt successful  Had pain with tight fist or after working using tools   Review modifications at work again - and pt report starting to do at work this past week   Follow up in 3 wks            OT Education - 12/08/20 2018     Education Details progress and changes to HEP    Person(s) Educated Patient    Methods Demonstration;Explanation;Tactile cues;Verbal cues;Handout    Comprehension Verbal cues required;Returned demonstration;Verbalized understanding              OT Short Term Goals - 10/23/20 1222       OT SHORT TERM GOAL #1   Title Pt wear and tolerate night extention splint for Exention of R digits improve to [facility] to be able to wash face and donn gloves    Status Achieved               OT Long Term Goals - 12/08/20 2025       OT LONG TERM GOAL #1   Title R  hand  incision heal , and dscar tissue and edema decrease for pt to show increase AROM to make tight fist to grip utenciles and tools    Baseline MC's flexion 65-80 with pain and tightness, PIP flexion 90 to 95- NOW able to touch palm -but not in am - PIP's 95 to100- MC's 80-90    Time 3    Period Weeks    Status On-going    Target Date 12/29/20      OT LONG TERM GOAL #2   Title R hand incision healt and scar improve for pt to grip objects and use hand in ADL's and work tools without increase tenderness  or pain or symptoms less than 2/10    Baseline pain increase 8/10 scar , and PROM - and not able grip any objects- incision open- scar in middle still tender this date -and thick - delayed healing - cannot grip objects or tight grip - composite fist pain 3-4/10    Time 6    Period Weeks    Status On-going    Target Date 01/19/21      OT LONG TERM GOAL #3   Title R wrist AROM and strength increase to [facility] and same as prior to  surgery to return to work    Baseline CTS - healed but tight -and decrease flexion 62, ext 54 - but had scaphoid fx with ORIF in past and did not had normal AROM - not using hand in any ADL's or IADL's yet    Status Deferred      OT LONG TERM GOAL #4   Title R  grip and prehension strength increase to more than 75% compare to L hand to grip tools and utenciles and return to prior level of function    Baseline incision still open- stiffness and edema in digits- decrease flexion and extention of dgiits- 6/30/2 - assess today - pain with grip -grip R 67 L 100- 3 point grip and Lat grip WNL    Time 6    Period Weeks    Status On-going    Target Date 01/19/21                   Plan - 12/08/20 2019     Clinical Impression Statement Pt is more than 3 month s/p R hand endoscopy CTR and dupuytrens release of palm and 3rd digit - Pt show increase AROM  in R hand with PIP's WNL and MC's 80 -90 decrease with tightness mosly over 3rd and 2nd - but pt report had had pain in 3rd MC prior and / arthritis.  Grip increase -and prehensoin strength WNL compare to L hand - scar tissue inpalm stil tight and limiting full extention at PIP of 4th - but pt can get easy full extention - but to cont wtih scar massage because of delayed healing - Pt to focus on composite PROM for lfexion of 4th and 5th that is his power grip - decrease PROM and causing pain to 3rd and 2nd MC's.  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 Pt to work on decrease edema, scar tissue and pain and maintain AROM - increase grip - pt to follow upin 3 wks.    OT Occupational Profile and History Problem Focused Assessment - Including review of records relating to presenting problem    Occupational performance deficits (Please refer to evaluation for details): ADL's;IADL's;Work;Play;Leisure;Social  Participation;Rest and Sleep    Body Structure / Function / Physical Skills ADL;Decreased knowledge of precautions;Coordination;Edema;Dexterity;Decreased knowledge of use of DME;Strength;Pain;UE functional use;ROM;IADL;Scar mobility;Skin integrity;FMC;Flexibility    Rehab Potential Good    Clinical Decision Making Limited treatment options, no task modification necessary    Comorbidities Affecting Occupational Performance: None    Modification or Assistance to Complete Evaluation  No modification of tasks or assist necessary to complete eval    OT Frequency --   3 wks   OT Duration 6 weeks    OT Treatment/Interventions Self-care/ADL training;Paraffin;Fluidtherapy;Contrast Bath;Therapeutic exercise;Manual Therapy;DME  and/or AE instruction;Splinting;Therapeutic activities;Scar mobilization;Passive range of motion;Patient/family education    Consulted and Agree with Plan of Care Patient             Patient will benefit from skilled therapeutic intervention in order to improve the following deficits and impairments:   Body Structure / Function / Physical Skills: ADL, Decreased knowledge of precautions, Coordination, Edema, Dexterity, Decreased knowledge of use of DME, Strength, Pain, UE functional use, ROM, IADL, Scar mobility, Skin integrity, FMC, Flexibility       Visit Diagnosis: Stiffness of right hand, not elsewhere classified - Plan: Ot plan of care cert/re-cert  Pain in right hand - Plan: Ot plan of care cert/re-cert  Muscle weakness (generalized) - Plan: Ot plan of care cert/re-cert  Scar condition and fibrosis of skin - Plan: Ot plan of care cert/re-cert    Problem List Patient Active Problem List   Diagnosis Date Noted   Urticaria 11/15/2014    Oletta Cohn OTR/L,CLT 12/08/2020, 8:30 PM  [place] [facility] [address], [place], [address] Phone: [phone]   Fax:  [phone]  Name: Paul Contreras MRN: [id number] Date of Birth: 05-Sep-1980

## 2020-12-30 ENCOUNTER — Ambulatory Visit: Payer: 59 | Attending: Student | Admitting: Occupational Therapy

## 2020-12-30 DIAGNOSIS — L905 Scar conditions and fibrosis of skin: Secondary | ICD-10-CM | POA: Diagnosis present

## 2020-12-30 DIAGNOSIS — M25641 Stiffness of right hand, not elsewhere classified: Secondary | ICD-10-CM

## 2020-12-30 DIAGNOSIS — M25631 Stiffness of right wrist, not elsewhere classified: Secondary | ICD-10-CM | POA: Diagnosis present

## 2020-12-30 DIAGNOSIS — M6281 Muscle weakness (generalized): Secondary | ICD-10-CM | POA: Diagnosis present

## 2020-12-30 DIAGNOSIS — M79641 Pain in right hand: Secondary | ICD-10-CM | POA: Diagnosis present

## 2020-12-30 NOTE — Therapy (Signed)
[place] [facility] [address] [place], [address] Phone: [phone]   Fax:  [phone]  Occupational Therapy Treatment/discharge  Patient Details  Name: Paul Contreras MRN: [id number] Date of Birth: Sep 02, 1980 Referring Provider (OT): Dr Joice Lofts   Encounter Date: 12/30/2020   OT End of Session - 12/30/20 1610     Visit Number 10    Number of Visits 10    Date for OT Re-Evaluation 12/30/20    OT Start Time 1410    OT Stop Time 1445    OT Time Calculation (min) 35 min    Activity Tolerance Patient tolerated treatment well    Behavior During Therapy [facility] for tasks assessed/performed             Past Medical History:  Diagnosis Date   Anxiety    Arthritis    Complication of anesthesia    nausea   Fainting spell 11/15/2014   History of chicken pox    Kawasaki disease (HCC)    PONV (postoperative nausea and vomiting)    Umbilical hernia     Past Surgical History:  Procedure Laterality Date   CYST REMOVAL HAND Left    arm    dog bite  2014   right arm   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   Subjective Assessment - 12/30/20 1607     Subjective  My hand has been doing great since I seen you last time- doing the hot and cold in the am and the exercises and hearing your voice during the day not to abuse my hands at work - but think smarter - and then at night again and massage the scar - the only thing still bother me is the middle finger joint that I probably have arthritis in     Pertinent History Dr Joice Lofts on 09/03/20 done  Endoscopic right carpal tunnel release.    2. Excision of Dupuytren's contracture, right long finger.  3. Excision of soft tissue masses over dorsum of right index, long, and ring PIP joints.   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 No/denies                [facility] OT Assessment - 12/30/20 0001       AROM   Right Wrist Extension 55 Degrees    Right Wrist Flexion 70 Degrees      Strength   Right Hand Grip (lbs) 72    Right Hand Lateral Pinch 31 lbs    Right Hand 3 Point Pinch 24 lbs    Left Hand Grip (lbs) 100    Left Hand Lateral Pinch 35 lbs    Left Hand 3 Point Pinch 22 lbs      Right Hand AROM   R  Index  MCP 0-90 85 Degrees    R Index PIP 0-100 100 Degrees    R Long  MCP 0-90 85 Degrees    R Long PIP 0-100 100 Degrees    R Ring  MCP 0-90 85 Degrees    R Ring PIP 0-100 100 Degrees    R Little  MCP 0-90 90 Degrees    R Little PIP 0-100 95 Degrees                Pt  follow up after 3 wks of doing 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  Pt to [employer] with HEP for another 6 wks and gradually see if can decrease             OT Education - 12/30/20 1609     Education Details progress, discharge instructions    Person(s) Educated Patient    Methods Demonstration;Explanation;Tactile cues;Verbal cues;Handout    Comprehension Verbal cues required;Returned demonstration;Verbalized understanding              OT Short Term Goals - 12/30/20 1614       OT SHORT TERM GOAL #1   Title Pt wear and tolerate night extention splint for Exention of R digits improve to [facility] to be able to wash face and donn gloves    Status Achieved               OT Long Term Goals - 12/30/20 1614       OT LONG TERM GOAL #1   Title R  hand  incision heal , and dscar tissue and edema decrease for pt to show increase AROM to make tight fist to grip utenciles and tools    Baseline MC's flexion 65-80 with pain and tightness, PIP flexion 90 to 95- NOW able to touch palm  and MC's 85-90 and PIP's 100    Status Achieved      OT LONG TERM GOAL #2   Title R hand incision healt and scar improve for pt to grip objects and use hand in ADL's and work tools without increase tenderness or pain or symptoms less than 2/10    Status Achieved      OT LONG TERM GOAL #3   Title R wrist AROM and strength increase to [facility] and same as prior to surgery to return to work    Status Achieved      OT LONG TERM GOAL #4  Title R  grip and prehension strength increase to more than 75% compare to L hand to grip tools and utenciles and return to prior level of function    Status Achieved                   Plan - 12/30/20 1610     Clinical Impression Statement Pt is about 4  1/2 monthsh s/p R hand endoscopy CTR and dupuytrens release of palm and 3rd digit - Pt  this date with great progress in edema and AROM of digits compare to 3 wks ago - pt MC's flexion icnrease to  85-90 and PIP's 100 - still some stiffness in the am but do great after doing some heat or contrast - still some pain or tightness mostly in 3rd MC - but pt report had had pain in 3rd MC prior and / arthritis.  Grip increase and prehensoin strength increase and are  WNL compare to L hand and his age- scar improved greatly. 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 Pt can cont with HEP for another 6 wks.    OT Occupational Profile and History Problem Focused Assessment - Including review of records relating to presenting problem    Occupational performance deficits (Please refer to evaluation for details): ADL's;IADL's;Work;Play;Leisure;Social Participation;Rest and Sleep    Body Structure / Function / Physical Skills ADL;Decreased knowledge of precautions;Coordination;Edema;Dexterity;Decreased knowledge of use of DME;Strength;Pain;UE functional use;ROM;IADL;Scar mobility;Skin integrity;FMC;Flexibility    Rehab Potential Good    Clinical Decision Making Limited treatment options, no task modification necessary    Comorbidities Affecting Occupational Performance: None    Modification or Assistance to Complete Evaluation  No modification of tasks or assist necessary to complete eval    OT Treatment/Interventions Self-care/ADL training;Paraffin;Fluidtherapy;Contrast Bath;Therapeutic exercise;Manual Therapy;DME and/or AE instruction;Splinting;Therapeutic activities;Scar mobilization;Passive range of motion;Patient/family education    Consulted and Agree with Plan of Care Patient             Patient will benefit from skilled therapeutic intervention in order to improve the following deficits and impairments:   Body Structure / Function / Physical Skills: ADL, Decreased knowledge of precautions, Coordination, Edema, Dexterity, Decreased  knowledge of use of DME, Strength, Pain, UE functional use, ROM, IADL, Scar mobility, Skin integrity, FMC, Flexibility       Visit Diagnosis: Stiffness of right hand, not elsewhere classified  Pain in right hand  Muscle weakness (generalized)  Scar condition and fibrosis of skin  Stiffness of right wrist, not elsewhere classified    Problem List Patient Active Problem List   Diagnosis Date Noted   Urticaria 11/15/2014    Oletta Cohn OTR?L,CLT 12/30/2020, 4:16 PM  [place] [facility] [address] [place], [address] Phone: [phone]   Fax:  [phone]  Name: JAMUS LOVING MRN: [id number] Date of Birth: 12-04-1980

## 2021-05-12 DIAGNOSIS — U071 COVID-19: Secondary | ICD-10-CM | POA: Diagnosis not present

## 2021-05-16 ENCOUNTER — Other Ambulatory Visit: Payer: Self-pay

## 2021-05-16 ENCOUNTER — Emergency Department: Payer: 59

## 2021-05-16 ENCOUNTER — Emergency Department
Admission: EM | Admit: 2021-05-16 | Discharge: 2021-05-17 | Disposition: A | Discharge status: Home / Self Care | Payer: 59 | Attending: Emergency Medicine | Admitting: Emergency Medicine

## 2021-05-16 DIAGNOSIS — U071 COVID-19: Secondary | ICD-10-CM | POA: Insufficient documentation

## 2021-05-16 DIAGNOSIS — R1013 Epigastric pain: Secondary | ICD-10-CM

## 2021-05-16 DIAGNOSIS — R079 Chest pain, unspecified: Secondary | ICD-10-CM | POA: Diagnosis not present

## 2021-05-16 DIAGNOSIS — R109 Unspecified abdominal pain: Secondary | ICD-10-CM | POA: Diagnosis not present

## 2021-05-16 LAB — URINALYSIS, ROUTINE W REFLEX MICROSCOPIC
Bilirubin Urine: NEGATIVE
Glucose, UA: NEGATIVE mg/dL
Hgb urine dipstick: NEGATIVE
Ketones, ur: NEGATIVE mg/dL
Leukocytes,Ua: NEGATIVE
Nitrite: NEGATIVE
Protein, ur: NEGATIVE mg/dL
Specific Gravity, Urine: >1.046 — ABNORMAL HIGH (ref 1.005–1.030)
pH: 6 (ref 5.0–8.0)

## 2021-05-16 LAB — COMPREHENSIVE METABOLIC PANEL
ALT: 31 U/L (ref 0–44)
AST: 58 U/L — ABNORMAL HIGH (ref 15–41)
Albumin: 4.5 g/dL (ref 3.5–5.0)
Alkaline Phosphatase: 51 U/L (ref 38–126)
Anion gap: 9 (ref 5–15)
BUN: 13 mg/dL (ref 6–20)
CO2: 33 mmol/L — ABNORMAL HIGH (ref 22–32)
Calcium: 9 mg/dL (ref 8.9–10.3)
Chloride: 96 mmol/L — ABNORMAL LOW (ref 98–111)
Creatinine, Ser: 0.77 mg/dL (ref 0.61–1.24)
GFR, Estimated: >60 mL/min (ref >60)
Glucose, Bld: 88 mg/dL (ref 70–99)
Potassium: 5 mmol/L (ref 3.5–5.1)
Sodium: 138 mmol/L (ref 135–145)
Total Bilirubin: 0.3 mg/dL (ref 0.3–1.2)
Total Protein: 7.4 g/dL (ref 6.5–8.1)

## 2021-05-16 LAB — TROPONIN I (HIGH SENSITIVITY): Troponin I (High Sensitivity): 3 ng/L (ref <18)

## 2021-05-16 LAB — CBC
HCT: 47.7 % (ref 39.0–52.0)
Hemoglobin: 16.3 g/dL (ref 13.0–17.0)
MCH: 30.1 pg (ref 26.0–34.0)
MCHC: 34.2 g/dL (ref 30.0–36.0)
MCV: 88 fL (ref 80.0–100.0)
Platelets: 280 10*3/uL (ref 150–400)
RBC: 5.42 MIL/uL (ref 4.22–5.81)
RDW: 12.4 % (ref 11.5–15.5)
WBC: 3.9 10*3/uL — ABNORMAL LOW (ref 4.0–10.5)
nRBC: 0 % (ref 0.0–0.2)

## 2021-05-16 LAB — RESP PANEL BY RT-PCR (FLU A&B, COVID) ARPGX2
Influenza A by PCR: NEGATIVE
Influenza B by PCR: NEGATIVE
SARS Coronavirus 2 by RT PCR: POSITIVE — AB

## 2021-05-16 LAB — LIPASE, BLOOD: Lipase: 29 U/L (ref 11–51)

## 2021-05-16 MED ORDER — FENTANYL CITRATE PF 50 MCG/ML IJ SOSY
50.0000 ug | PREFILLED_SYRINGE | Freq: Once | INTRAMUSCULAR | Status: AC
  Administered 2021-05-17: 50 ug via INTRAVENOUS
  Filled 2021-05-16: qty 1

## 2021-05-16 MED ORDER — IOHEXOL 300 MG/ML  SOLN
100.0000 mL | Freq: Once | INTRAMUSCULAR | Status: AC | PRN
  Administered 2021-05-16: 100 mL via INTRAVENOUS

## 2021-05-16 MED ORDER — ONDANSETRON HCL 4 MG/2ML IJ SOLN
4.0000 mg | Freq: Once | INTRAMUSCULAR | Status: AC
  Administered 2021-05-17: 4 mg via INTRAVENOUS
  Filled 2021-05-16: qty 2

## 2021-05-16 NOTE — ED Triage Notes (Signed)
Pt states he has been having pain in his upper abd just below his sternum- pt tested positive for covid on Monday- pt denies n/v/d- pt endorses fever- abd pain started Thursday afternoon

## 2021-05-16 NOTE — ED Provider Triage Note (Signed)
Emergency Medicine Provider Triage Evaluation Note  Paul Contreras , a 41 y.o. male  was evaluated in triage.  Pt complains of sharp epigastric abdominal pain.  Patient has pain worse after eating.  No history of problems with his gallbladder.  Patient has all of his organs including appendix and gallbladder.  Has been on anti-inflammatories for several years for arthritis.  Recently diagnosed with COVID.  Patient is nauseated but no emesis.  No diarrhea or constipation..  Review of Systems  Positive: Sharp epigastric abdominal pain with nausea Negative: Emesis, diarrhea, constipation, fevers, chills, cough, shortness of breath  Physical Exam  Ht 5\' 7"  (1.702 m)    Wt 63.5 kg    BMI 21.93 kg/m  Gen:   Awake, no distress   Resp:  Normal effort  MSK:   Moves extremities without difficulty  Other:  Bowel sounds present.  Tender in the epigastric and right upper quadrant  Medical Decision Making  Medically screening exam initiated at 4:52 PM.  Appropriate orders placed.  Paul Contreras was informed that the remainder of the evaluation will be completed by another provider, this initial triage assessment does not replace that evaluation, and the importance of remaining in the ED until their evaluation is complete.  Patient presents with sharp epigastric/right upper quadrant abdominal pain.  Patient is currently COVID-positive.  Patient still has his gallbladder.  Will order labs, CT scan at this time   Effie Shy, PA-C 05/16/21 1658

## 2021-05-16 NOTE — ED Notes (Signed)
Pt c/o of central abdominal pain that started Thursday. Pt describe the pain as a stabbing, sharp pain. Pt c/o SOB. Pt took a Covid home test Monday and tested positive.   Pt denies NVD.

## 2021-05-17 ENCOUNTER — Emergency Department: Payer: 59

## 2021-05-17 DIAGNOSIS — R079 Chest pain, unspecified: Secondary | ICD-10-CM | POA: Diagnosis not present

## 2021-05-17 DIAGNOSIS — R109 Unspecified abdominal pain: Secondary | ICD-10-CM | POA: Diagnosis not present

## 2021-05-17 LAB — TROPONIN I (HIGH SENSITIVITY): Troponin I (High Sensitivity): <2 ng/L (ref <18)

## 2021-05-17 LAB — D-DIMER, QUANTITATIVE: D-Dimer, Quant: 0.48 ug/mL-FEU (ref 0.00–0.50)

## 2021-05-17 MED ORDER — TRAMADOL HCL 50 MG PO TABS
50.0000 mg | ORAL_TABLET | Freq: Once | ORAL | Status: AC
  Administered 2021-05-17: 50 mg via ORAL
  Filled 2021-05-17: qty 1

## 2021-05-17 MED ORDER — ONDANSETRON HCL 4 MG PO TABS: 4.0000 mg | ORAL_TABLET | Freq: Every day | ORAL | 1 refills | Status: AC | PRN

## 2021-05-17 MED ORDER — MAALOX MAX 400-400-40 MG/5ML PO SUSP: 5.0000 mL | Freq: Four times a day (QID) | ORAL | 0 refills | Status: AC | PRN

## 2021-05-17 MED ORDER — PANTOPRAZOLE SODIUM 40 MG PO TBEC: 40.0000 mg | DELAYED_RELEASE_TABLET | Freq: Every day | ORAL | 1 refills | Status: AC

## 2021-05-17 MED ORDER — TRAMADOL HCL 50 MG PO TABS
50.0000 mg | ORAL_TABLET | Freq: Four times a day (QID) | ORAL | 0 refills | Status: AC | PRN
Start: 2021-05-17 — End: 2022-05-17

## 2021-05-17 NOTE — Discharge Instructions (Signed)

## 2021-05-17 NOTE — ED Notes (Signed)
Pt verbalized understanding of discharge instructions, prescriptions, and follow-up care. Pt refused to obtain vital signs prior to being discharge. Pt advised if symptoms worsen to return to ED. E-signature not available at this time.

## 2021-05-17 NOTE — ED Provider Notes (Signed)
[facility] Provider Note    Event Date/Time   First MD Initiated Contact with Patient 05/16/21 2341     (approximate)   History   Abdominal Pain   HPI  [name] is a 41 y.o. male with history of anxiety, RA on methotrexate, and umbilical hernia who presents for evaluation of abdominal pain.  Patient reports that he tested positive for COVID on Monday after having a fever and having one of his children tested positive for COVID.  He had fever for just 1 day.  Has not had any many other symptoms.  3 days later he started having epigastric abdominal pain that he describes as intermittent, stabbing sharp pain.  The pain is located in the epigastric region and it does not radiate.  He denies chest pain, shortness of breath, nausea, vomiting, diarrhea.  He denies history of alcohol or drug use.  Denies any history of peptic ulcer disease.     Past Medical History:  Diagnosis Date   Anxiety    Arthritis    Complication of anesthesia    nausea   Fainting spell 11/15/2014   History of chicken pox    Kawasaki disease (HCC)    PONV (postoperative nausea and vomiting)    Umbilical hernia     Past Surgical History:  Procedure Laterality Date   CYST REMOVAL HAND Left    arm    dog bite  2014   right arm   DUPUYTREN CONTRACTURE RELEASE Right 09/03/2020   Procedure: ENDOSCOPIC RIGHT CARPAL TUNNEL RELEASE WITH EXCISION OF A DUPUYTREN'S CONTRACTURE OF RIGHT RING FINGER, masses on right indes/long/ring PIP joints;  Surgeon: Christena Flake, MD;  Location: [facility] ORS;  Service: Orthopedics;  Laterality: Right;   HERNIA REPAIR  1981   hydrocele repair; pt was 25 months old   WRIST SURGERY Right 1998   has 1 screw in his wrist     Physical Exam   Triage Vital Signs: ED Triage Vitals  Enc Vitals Group     BP 05/16/21 1652 112/86     Pulse Rate 05/16/21 1652 90     Resp 05/16/21 1652 18     Temp 05/16/21 1655 98.3 F (36.8 C)     Temp Source 05/16/21 1655  Oral     SpO2 05/16/21 1652 99 %     Weight 05/16/21 1652 140 lb (63.5 kg)     Height 05/16/21 1652 5\' 7"  (1.702 m)     Head Circumference --      Peak Flow --      Pain Score 05/16/21 1652 6     Pain Loc --      Pain Edu? --      Excl. in GC? --     Most recent vital signs: Vitals:   05/17/21 0000 05/17/21 0130  BP: (!) 117/91 120/89  Pulse: 88 93  Resp: (!) 21 16  Temp:    SpO2: 97% 99%     Constitutional: Alert and oriented, looks uncomfortable due to pain. HEENT:      Head: Normocephalic and atraumatic.         Eyes: Conjunctivae are normal. Sclera is non-icteric.       Mouth/Throat: Mucous membranes are moist.       Neck: Supple with no signs of meningismus. Cardiovascular: Regular rate and rhythm. No murmurs, gallops, or rubs. 2+ symmetrical distal pulses are present in all extremities.  Respiratory: Normal respiratory effort. Lungs are clear to  auscultation bilaterally.  Gastrointestinal: Soft, tender to palpation in the epigastric region, and non distended with positive bowel sounds. No rebound or guarding. Genitourinary: No CVA tenderness. Musculoskeletal:  No edema, cyanosis, or erythema of extremities. Neurologic: Normal speech and language. Face is symmetric. Moving all extremities. No gross focal neurologic deficits are appreciated. Skin: Skin is warm, dry and intact. No rash noted. Psychiatric: Mood and affect are normal. Speech and behavior are normal.  ED Results / Procedures / Treatments   Labs (all labs ordered are listed, but only abnormal results are displayed) Labs Reviewed  RESP PANEL BY RT-PCR (FLU A&B, COVID) ARPGX2 - Abnormal; Notable for the following components:      Result Value   SARS Coronavirus 2 by RT PCR POSITIVE (*)    All other components within normal limits  COMPREHENSIVE METABOLIC PANEL - Abnormal; Notable for the following components:   Chloride 96 (*)    CO2 33 (*)    AST 58 (*)    All other components within normal limits   CBC - Abnormal; Notable for the following components:   WBC 3.9 (*)    All other components within normal limits  URINALYSIS, ROUTINE W REFLEX MICROSCOPIC - Abnormal; Notable for the following components:   Color, Urine YELLOW (*)    APPearance CLEAR (*)    Specific Gravity, Urine >1.046 (*)    All other components within normal limits  LIPASE, BLOOD  D-DIMER, QUANTITATIVE  TROPONIN I (HIGH SENSITIVITY)  TROPONIN I (HIGH SENSITIVITY)     EKG  ED ECG REPORT I, Rudene Re, the attending physician, personally viewed and interpreted this ECG.  Sinus rhythm with rate of 79, normal intervals, normal axis, no ST elevations or depressions.  Unchanged from prior from April 2022   RADIOLOGY I, [place], attending MD, have personally viewed and interpreted the images obtained during this visit as below:  CT abdomen pelvis with no acute pathology, chest x-ray with no signs of pneumonia   ___________________________________________________ Interpretation by Radiologist:  CT ABDOMEN PELVIS W CONTRAST  Result Date: 05/16/2021 CLINICAL DATA:  Epigastric pain EXAM: CT ABDOMEN AND PELVIS WITH CONTRAST TECHNIQUE: Multidetector CT imaging of the abdomen and pelvis was performed using the standard protocol following bolus administration of intravenous contrast. RADIATION DOSE REDUCTION: This exam was performed according to the departmental dose-optimization program which includes automated exposure control, adjustment of the mA and/or kV according to patient size and/or use of iterative reconstruction technique. CONTRAST:  120mL OMNIPAQUE IOHEXOL 300 MG/ML  SOLN COMPARISON:  None. FINDINGS: Lower chest: No acute abnormality. Hepatobiliary: No focal hepatic abnormality. Gallbladder unremarkable. Pancreas: No focal abnormality or ductal dilatation. Spleen: No focal abnormality.  Normal size. Adrenals/Urinary Tract: No adrenal abnormality. No focal renal abnormality. No stones or  hydronephrosis. Urinary bladder is unremarkable. Stomach/Bowel: Stomach, large and small bowel grossly unremarkable. Normal appendix. Vascular/Lymphatic: No evidence of aneurysm or adenopathy. Reproductive: No visible focal abnormality. Other: No free fluid or free air. Musculoskeletal: No acute bony abnormality. IMPRESSION: No acute findings in the abdomen or pelvis. Electronically Signed   By: Rolm Baptise M.D.   On: 05/16/2021 18:25   DG Chest Portable 1 View  Result Date: 05/17/2021 CLINICAL DATA:  Epigastric abdominal pain EXAM: PORTABLE CHEST 1 VIEW COMPARISON:  08/12/2020 FINDINGS: The heart size and mediastinal contours are within normal limits. Both lungs are clear. The visualized skeletal structures are unremarkable. IMPRESSION: No active disease. Electronically Signed   By: Fidela Salisbury M.D.   On: 05/17/2021 00:09  PROCEDURES:  Critical Care performed: No  Procedures    IMPRESSION / MDM / ASSESSMENT AND PLAN / ED COURSE  I reviewed the triage vital signs and the nursing notes.  41 y.o. male with history of anxiety, RA on methotrexate, and umbilical hernia who presents for evaluation of abdominal pain.  Patient with 3 days of sharp intermittent epigastric abdominal pain after testing positive for COVID 5 days ago.  Patient looks uncomfortable due to pain, he is tender to palpation the epigastric region with no rebound or guarding.  Has not had any other symptoms.  Vitals are within normal limits.  Ddx: Peptic ulcer disease versus indigestion versus pancreatitis versus gallbladder pathology versus PE from COVID versus myocarditis versus pneumonia   Plan: CT abdomen pelvis, chest x-ray, EKG, troponin, COVID and flu swab, LFTs and lipase, CBC and chemistry panel, D-dimer.  Patient given a dose of IV fentanyl and Zofran for symptom relief   MEDICATIONS GIVEN IN ED: Medications  iohexol (OMNIPAQUE) 300 MG/ML solution 100 mL (100 mLs Intravenous Contrast Given 05/16/21 1808)   fentaNYL (SUBLIMAZE) injection 50 mcg (50 mcg Intravenous Given 05/17/21 0029)  ondansetron (ZOFRAN) injection 4 mg (4 mg Intravenous Given 05/17/21 0027)  traMADol (ULTRAM) tablet 50 mg (50 mg Oral Given 05/17/21 0241)     ED COURSE: EKG and troponins with no acute findings.  Patient still testing positive for COVID.  LFTs and lipase within normal limits.  Mild leukopenia associated with COVID but no signs of anemia.  D-dimer negative.  Chest x-ray with no signs of pneumonia.  CT abdomen pelvis with no acute pathology.  Patient remained stable and improved in the emergency room.  Admission was considered but felt unnecessary with a negative work-up.  I did discuss follow-up with GI if symptoms persist for possible evaluation for a peptic ulcer.  Patient has no signs of a perforated ulcer on exam and on CT.  I started him on Protonix and gave him a short course of tramadol for pain relief and Zofran.  I discussed strict return precautions with patient and his wife.   Consults: None   EMR reviewed including patient's last visit with primary care doctor for his arthritis       FINAL CLINICAL IMPRESSION(S) / ED DIAGNOSES   Final diagnoses:  Epigastric abdominal pain     Rx / DC Orders   ED Discharge Orders          Ordered    alum & mag hydroxide-simeth (MAALOX MAX) 400-400-40 MG/5ML suspension  Every 6 hours PRN        05/17/21 0152    pantoprazole (PROTONIX) 40 MG tablet  Daily        05/17/21 0152    ondansetron (ZOFRAN) 4 MG tablet  Daily PRN        05/17/21 0152    traMADol (ULTRAM) 50 MG tablet  Every 6 hours PRN        05/17/21 0152             Note:  This document was prepared using Dragon voice recognition software and may include unintentional dictation errors.   Alfred Levins, [place], MD 05/17/21 [phone]

## 2021-06-02 DIAGNOSIS — M064 Inflammatory polyarthropathy: Secondary | ICD-10-CM | POA: Diagnosis not present

## 2021-10-31 DIAGNOSIS — F339 Major depressive disorder, recurrent, unspecified: Secondary | ICD-10-CM | POA: Diagnosis not present

## 2021-11-28 DIAGNOSIS — F339 Major depressive disorder, recurrent, unspecified: Secondary | ICD-10-CM | POA: Diagnosis not present

## 2021-12-24 DIAGNOSIS — S99912A Unspecified injury of left ankle, initial encounter: Secondary | ICD-10-CM | POA: Diagnosis not present

## 2021-12-26 DIAGNOSIS — F339 Major depressive disorder, recurrent, unspecified: Secondary | ICD-10-CM | POA: Diagnosis not present

## 2022-01-23 DIAGNOSIS — F339 Major depressive disorder, recurrent, unspecified: Secondary | ICD-10-CM | POA: Diagnosis not present

## 2022-01-28 DIAGNOSIS — Z111 Encounter for screening for respiratory tuberculosis: Secondary | ICD-10-CM | POA: Diagnosis not present

## 2022-01-28 DIAGNOSIS — Z7960 Long term (current) use of unspecified immunomodulators and immunosuppressants: Secondary | ICD-10-CM | POA: Diagnosis not present

## 2022-01-28 DIAGNOSIS — M0609 Rheumatoid arthritis without rheumatoid factor, multiple sites: Secondary | ICD-10-CM | POA: Diagnosis not present

## 2022-02-20 DIAGNOSIS — F339 Major depressive disorder, recurrent, unspecified: Secondary | ICD-10-CM | POA: Diagnosis not present

## 2022-03-20 DIAGNOSIS — F339 Major depressive disorder, recurrent, unspecified: Secondary | ICD-10-CM | POA: Diagnosis not present

## 2022-04-04 DIAGNOSIS — M25522 Pain in left elbow: Secondary | ICD-10-CM | POA: Diagnosis not present

## 2022-04-04 DIAGNOSIS — M069 Rheumatoid arthritis, unspecified: Secondary | ICD-10-CM | POA: Diagnosis not present

## 2022-04-04 DIAGNOSIS — S59902A Unspecified injury of left elbow, initial encounter: Secondary | ICD-10-CM | POA: Diagnosis not present

## 2022-04-04 DIAGNOSIS — M79641 Pain in right hand: Secondary | ICD-10-CM | POA: Diagnosis not present

## 2022-04-04 DIAGNOSIS — M25532 Pain in left wrist: Secondary | ICD-10-CM | POA: Diagnosis not present

## 2022-04-04 DIAGNOSIS — M79642 Pain in left hand: Secondary | ICD-10-CM | POA: Diagnosis not present

## 2022-04-04 DIAGNOSIS — S6992XA Unspecified injury of left wrist, hand and finger(s), initial encounter: Secondary | ICD-10-CM | POA: Diagnosis not present

## 2022-04-17 DIAGNOSIS — F339 Major depressive disorder, recurrent, unspecified: Secondary | ICD-10-CM | POA: Diagnosis not present

## 2022-05-03 DIAGNOSIS — S0181XA Laceration without foreign body of other part of head, initial encounter: Secondary | ICD-10-CM | POA: Diagnosis not present

## 2022-05-03 DIAGNOSIS — Y92410 Unspecified street and highway as the place of occurrence of the external cause: Secondary | ICD-10-CM | POA: Diagnosis not present

## 2022-05-03 DIAGNOSIS — S060X1A Concussion with loss of consciousness of 30 minutes or less, initial encounter: Secondary | ICD-10-CM | POA: Diagnosis not present

## 2022-05-03 DIAGNOSIS — Z041 Encounter for examination and observation following transport accident: Secondary | ICD-10-CM | POA: Diagnosis not present

## 2022-05-03 DIAGNOSIS — S01112A Laceration without foreign body of left eyelid and periocular area, initial encounter: Secondary | ICD-10-CM | POA: Diagnosis not present

## 2022-05-15 DIAGNOSIS — F339 Major depressive disorder, recurrent, unspecified: Secondary | ICD-10-CM | POA: Diagnosis not present

## 2022-06-02 DIAGNOSIS — F1120 Opioid dependence, uncomplicated: Secondary | ICD-10-CM | POA: Diagnosis not present

## 2022-06-12 DIAGNOSIS — F339 Major depressive disorder, recurrent, unspecified: Secondary | ICD-10-CM | POA: Diagnosis not present

## 2022-06-14 DIAGNOSIS — F339 Major depressive disorder, recurrent, unspecified: Secondary | ICD-10-CM | POA: Diagnosis not present

## 2022-06-19 DIAGNOSIS — F339 Major depressive disorder, recurrent, unspecified: Secondary | ICD-10-CM | POA: Diagnosis not present

## 2022-06-21 DIAGNOSIS — F1120 Opioid dependence, uncomplicated: Secondary | ICD-10-CM | POA: Diagnosis not present

## 2022-06-24 DIAGNOSIS — F1120 Opioid dependence, uncomplicated: Secondary | ICD-10-CM | POA: Diagnosis not present

## 2022-06-26 DIAGNOSIS — F339 Major depressive disorder, recurrent, unspecified: Secondary | ICD-10-CM | POA: Diagnosis not present

## 2022-07-03 DIAGNOSIS — F339 Major depressive disorder, recurrent, unspecified: Secondary | ICD-10-CM | POA: Diagnosis not present

## 2022-07-10 DIAGNOSIS — F339 Major depressive disorder, recurrent, unspecified: Secondary | ICD-10-CM | POA: Diagnosis not present

## 2022-07-12 DIAGNOSIS — R0781 Pleurodynia: Secondary | ICD-10-CM | POA: Diagnosis not present

## 2022-07-12 DIAGNOSIS — R9431 Abnormal electrocardiogram [ECG] [EKG]: Secondary | ICD-10-CM | POA: Diagnosis not present

## 2022-07-12 DIAGNOSIS — R0600 Dyspnea, unspecified: Secondary | ICD-10-CM | POA: Diagnosis not present

## 2022-07-12 DIAGNOSIS — Z79899 Other long term (current) drug therapy: Secondary | ICD-10-CM | POA: Diagnosis not present

## 2022-07-12 DIAGNOSIS — M069 Rheumatoid arthritis, unspecified: Secondary | ICD-10-CM | POA: Diagnosis not present

## 2022-07-12 DIAGNOSIS — Y9389 Activity, other specified: Secondary | ICD-10-CM | POA: Diagnosis not present

## 2022-07-12 DIAGNOSIS — G8911 Acute pain due to trauma: Secondary | ICD-10-CM | POA: Diagnosis not present

## 2022-07-12 DIAGNOSIS — R0789 Other chest pain: Secondary | ICD-10-CM | POA: Diagnosis not present

## 2022-07-12 DIAGNOSIS — S20212A Contusion of left front wall of thorax, initial encounter: Secondary | ICD-10-CM | POA: Diagnosis not present

## 2022-07-12 DIAGNOSIS — R0689 Other abnormalities of breathing: Secondary | ICD-10-CM | POA: Diagnosis not present

## 2022-07-12 DIAGNOSIS — W01198A Fall on same level from slipping, tripping and stumbling with subsequent striking against other object, initial encounter: Secondary | ICD-10-CM | POA: Diagnosis not present

## 2022-07-17 DIAGNOSIS — F339 Major depressive disorder, recurrent, unspecified: Secondary | ICD-10-CM | POA: Diagnosis not present

## 2022-07-24 DIAGNOSIS — F339 Major depressive disorder, recurrent, unspecified: Secondary | ICD-10-CM | POA: Diagnosis not present

## 2022-07-31 DIAGNOSIS — F339 Major depressive disorder, recurrent, unspecified: Secondary | ICD-10-CM | POA: Diagnosis not present

## 2022-08-07 DIAGNOSIS — F339 Major depressive disorder, recurrent, unspecified: Secondary | ICD-10-CM | POA: Diagnosis not present

## 2022-08-14 DIAGNOSIS — F339 Major depressive disorder, recurrent, unspecified: Secondary | ICD-10-CM | POA: Diagnosis not present

## 2022-08-21 DIAGNOSIS — F339 Major depressive disorder, recurrent, unspecified: Secondary | ICD-10-CM | POA: Diagnosis not present

## 2022-09-04 DIAGNOSIS — F339 Major depressive disorder, recurrent, unspecified: Secondary | ICD-10-CM | POA: Diagnosis not present

## 2022-09-18 DIAGNOSIS — F339 Major depressive disorder, recurrent, unspecified: Secondary | ICD-10-CM | POA: Diagnosis not present

## 2022-10-03 DIAGNOSIS — F339 Major depressive disorder, recurrent, unspecified: Secondary | ICD-10-CM | POA: Diagnosis not present

## 2022-10-16 DIAGNOSIS — F339 Major depressive disorder, recurrent, unspecified: Secondary | ICD-10-CM | POA: Diagnosis not present

## 2022-10-30 DIAGNOSIS — F339 Major depressive disorder, recurrent, unspecified: Secondary | ICD-10-CM | POA: Diagnosis not present

## 2022-11-13 DIAGNOSIS — F339 Major depressive disorder, recurrent, unspecified: Secondary | ICD-10-CM | POA: Diagnosis not present

## 2022-11-27 DIAGNOSIS — F339 Major depressive disorder, recurrent, unspecified: Secondary | ICD-10-CM | POA: Diagnosis not present

## 2022-12-11 DIAGNOSIS — F339 Major depressive disorder, recurrent, unspecified: Secondary | ICD-10-CM | POA: Diagnosis not present

## 2022-12-25 DIAGNOSIS — F339 Major depressive disorder, recurrent, unspecified: Secondary | ICD-10-CM | POA: Diagnosis not present

## 2023-01-01 DIAGNOSIS — F339 Major depressive disorder, recurrent, unspecified: Secondary | ICD-10-CM | POA: Diagnosis not present

## 2023-01-08 DIAGNOSIS — F339 Major depressive disorder, recurrent, unspecified: Secondary | ICD-10-CM | POA: Diagnosis not present

## 2023-01-21 IMAGING — CT CT ABD-PELV W/ CM
2 of 5 series · 16 of 46 positions shown, 18 images · IV contrast (APPLIED)
Comparison: None.

CLINICAL DATA: Epigastric pain

EXAM:
CT ABDOMEN AND PELVIS WITH CONTRAST
TECHNIQUE: Multidetector CT imaging of the abdomen and pelvis was performed
using the standard protocol following bolus administration of
intravenous contrast.

[Series 2: routine abd/pel with · axial · 0.68mm/px · z∈[-339,+106]mm · 13 of 101 slices shown, 15 images]
[im 6/101  soft-tissue]
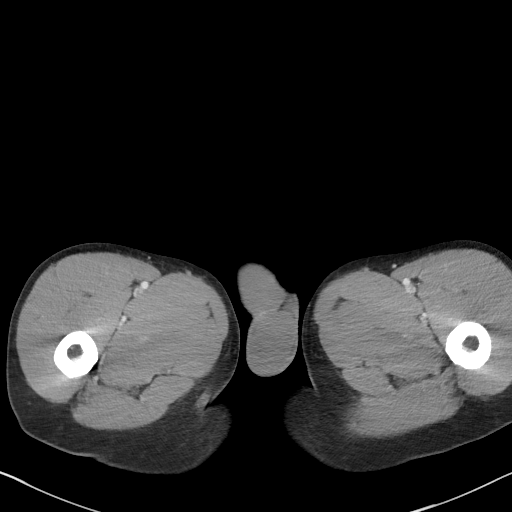
[im 6/101  bone]
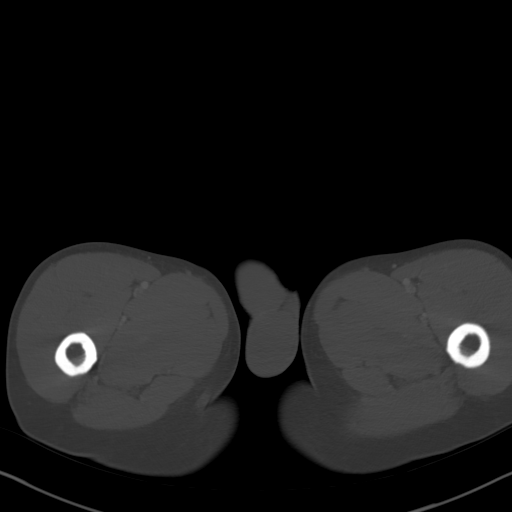
[im 16/101  soft-tissue]
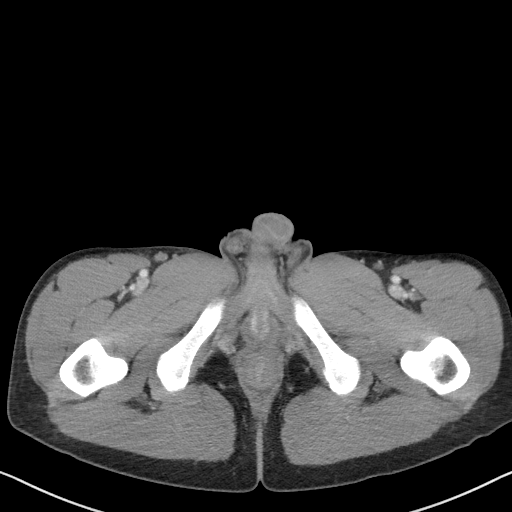
[im 22/101  soft-tissue]
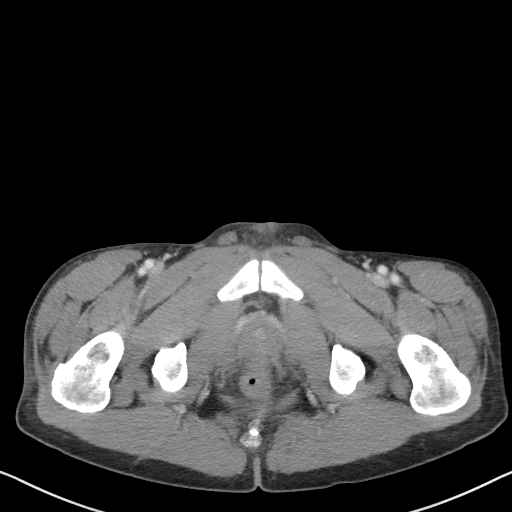
[im 27/101  soft-tissue]
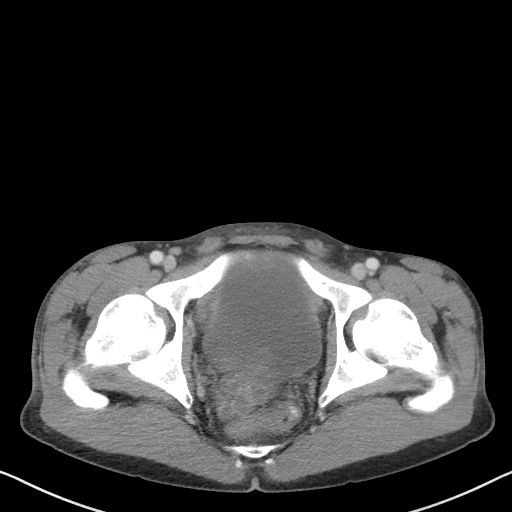
[im 37/101  soft-tissue]
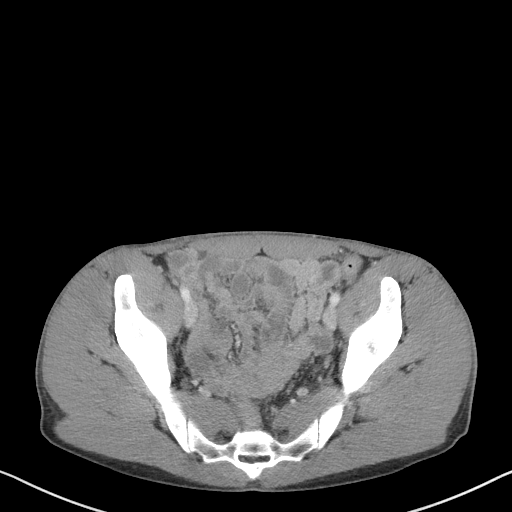
[im 43/101  soft-tissue]
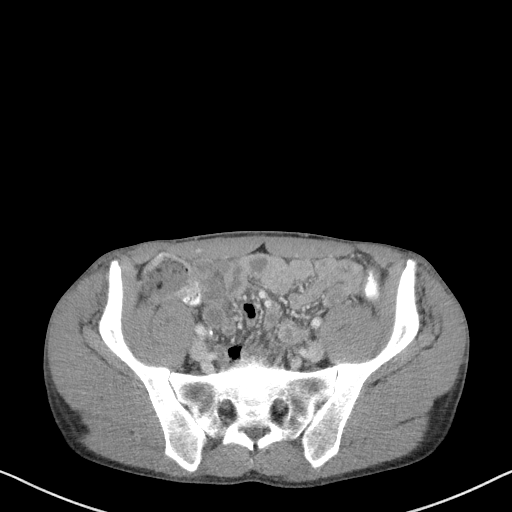
[im 53/101  soft-tissue]
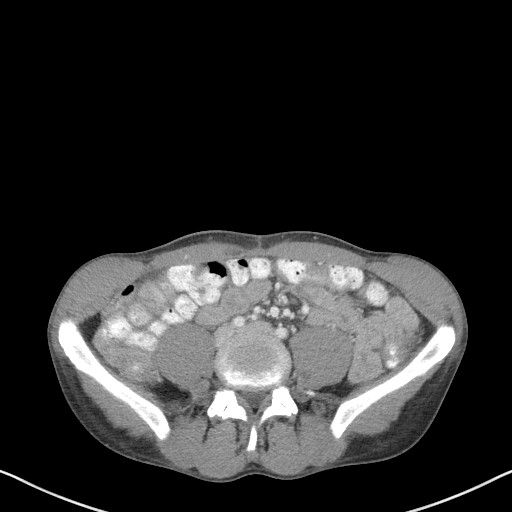
[im 58/101  soft-tissue]
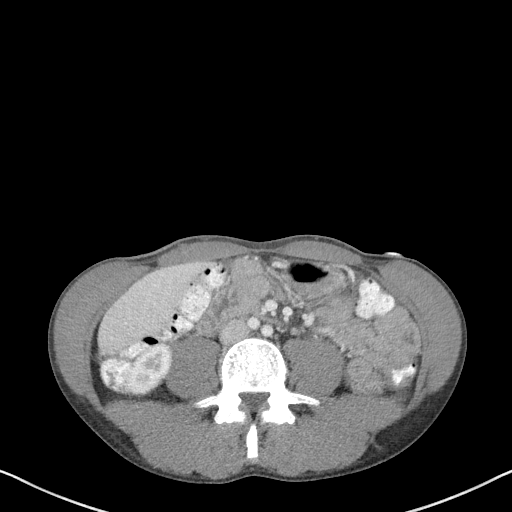
[im 64/101  soft-tissue]
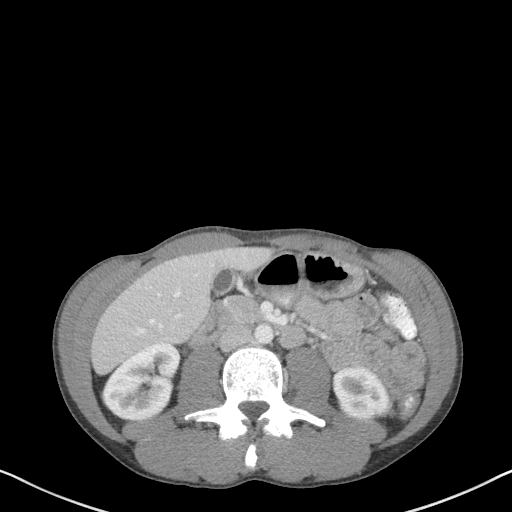
[im 64/101  bone]
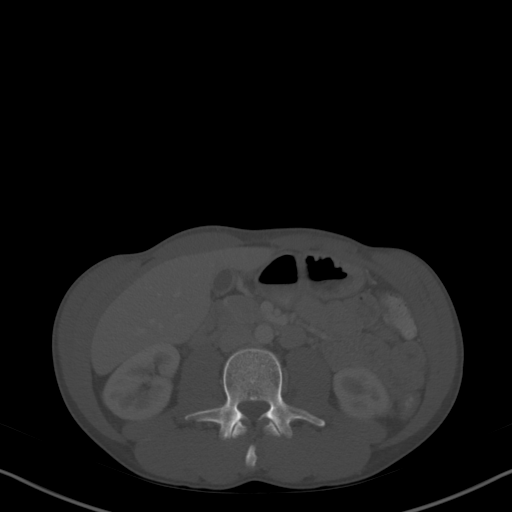
[im 74/101  soft-tissue]
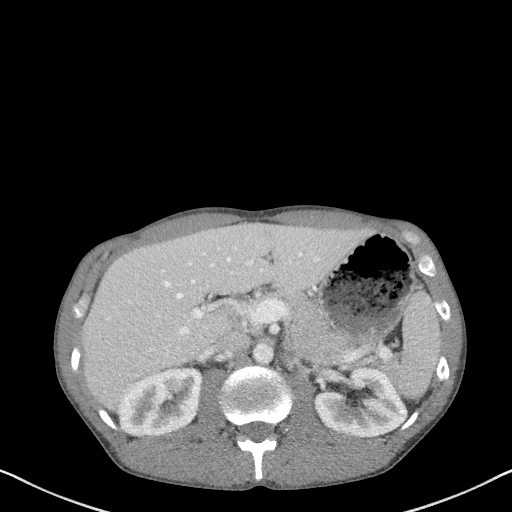
[im 79/101  soft-tissue]
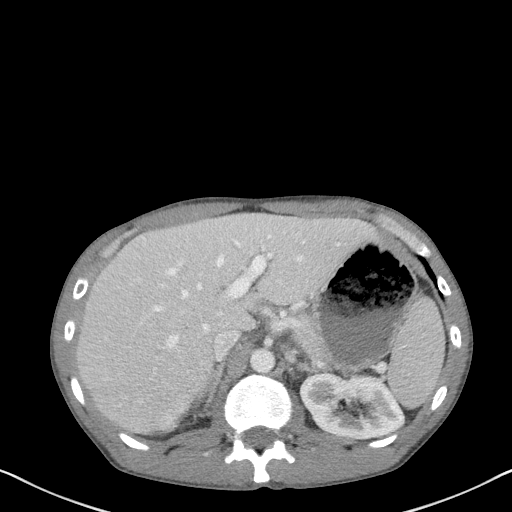
[im 85/101  soft-tissue]
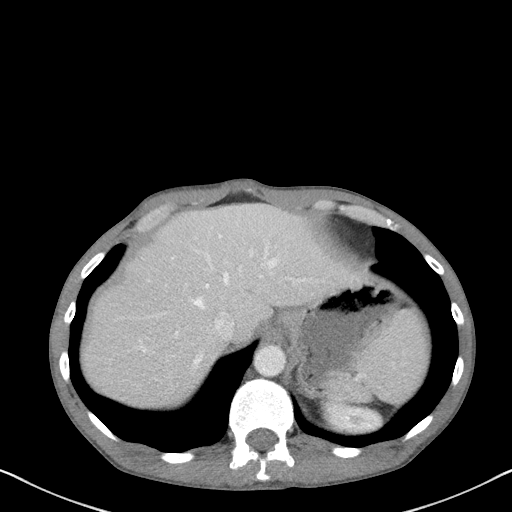
[im 95/101  soft-tissue]
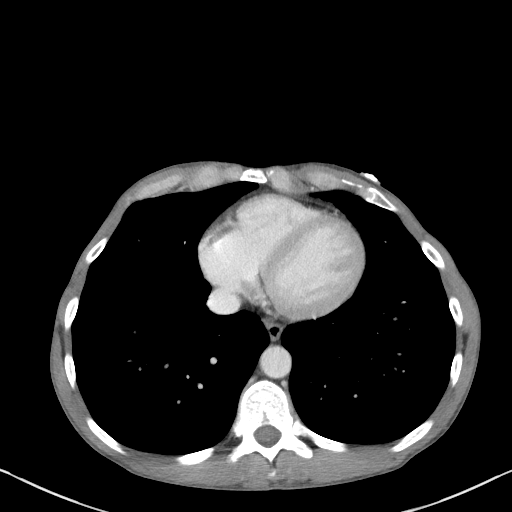

[Series 5: coronal st · coronal · 0.71mm/px · 3 of 81 slices shown]
[im 27/81  soft-tissue]
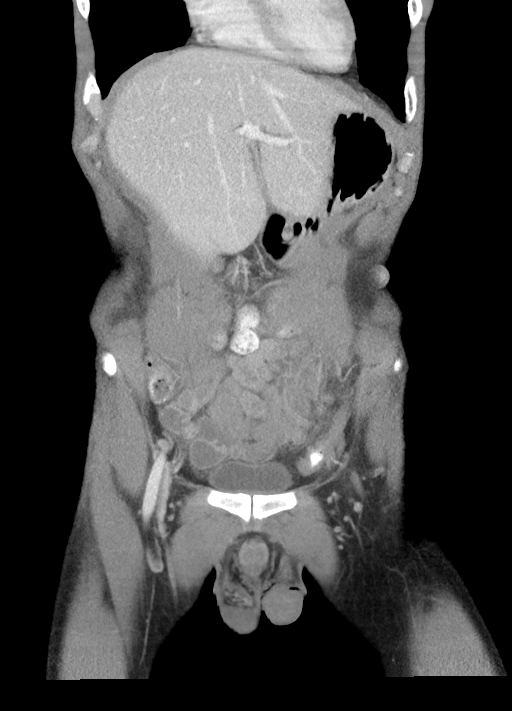
[im 36/81  soft-tissue]
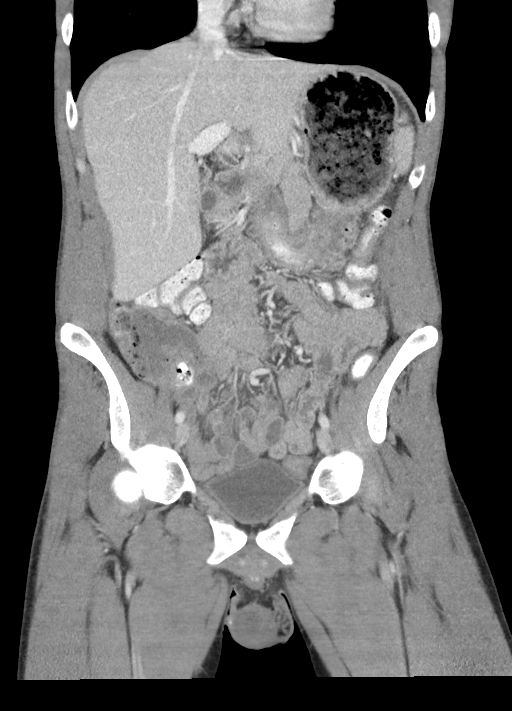
[im 45/81  soft-tissue]
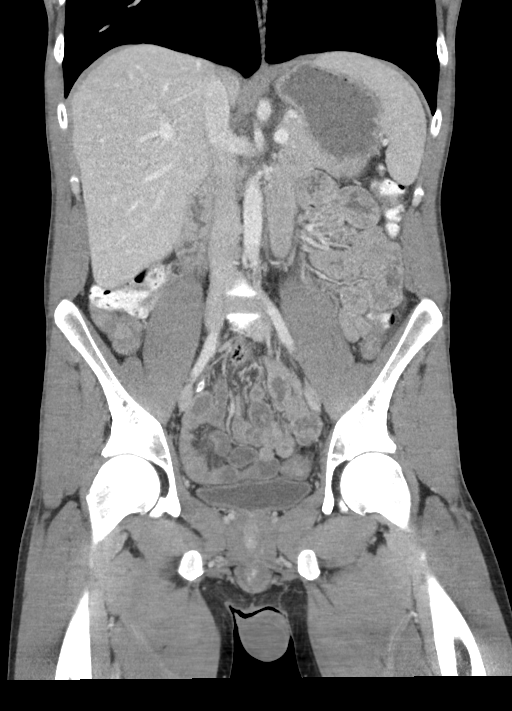

[16 of 46 positions shown; findings below may reference images not displayed]

RADIATION DOSE REDUCTION: This exam was performed according to the
departmental dose-optimization program which includes automated
exposure control, adjustment of the mA and/or kV according to
patient size and/or use of iterative reconstruction technique.

CONTRAST:  100mL OMNIPAQUE IOHEXOL 300 MG/ML  SOLN
FINDINGS: Lower chest: No acute abnormality.

Hepatobiliary: No focal hepatic abnormality. Gallbladder
unremarkable.

Pancreas: No focal abnormality or ductal dilatation.

Spleen: No focal abnormality.  Normal size.

Adrenals/Urinary Tract: No adrenal abnormality. No focal renal
abnormality. No stones or hydronephrosis. Urinary bladder is
unremarkable.

Stomach/Bowel: Stomach, large and small bowel grossly unremarkable.
Normal appendix.

Vascular/Lymphatic: No evidence of aneurysm or adenopathy.

Reproductive: No visible focal abnormality.

Other: No free fluid or free air.

Musculoskeletal: No acute bony abnormality.
IMPRESSION: No acute findings in the abdomen or pelvis.

## 2023-01-22 DIAGNOSIS — F339 Major depressive disorder, recurrent, unspecified: Secondary | ICD-10-CM | POA: Diagnosis not present

## 2023-01-22 IMAGING — DX DG CHEST 1V PORT
1 series · 1 of 1 positions shown · non-contrast
Comparison: 08/12/2020

CLINICAL DATA: Epigastric abdominal pain

EXAM:
PORTABLE CHEST 1 VIEW

[chest ap]
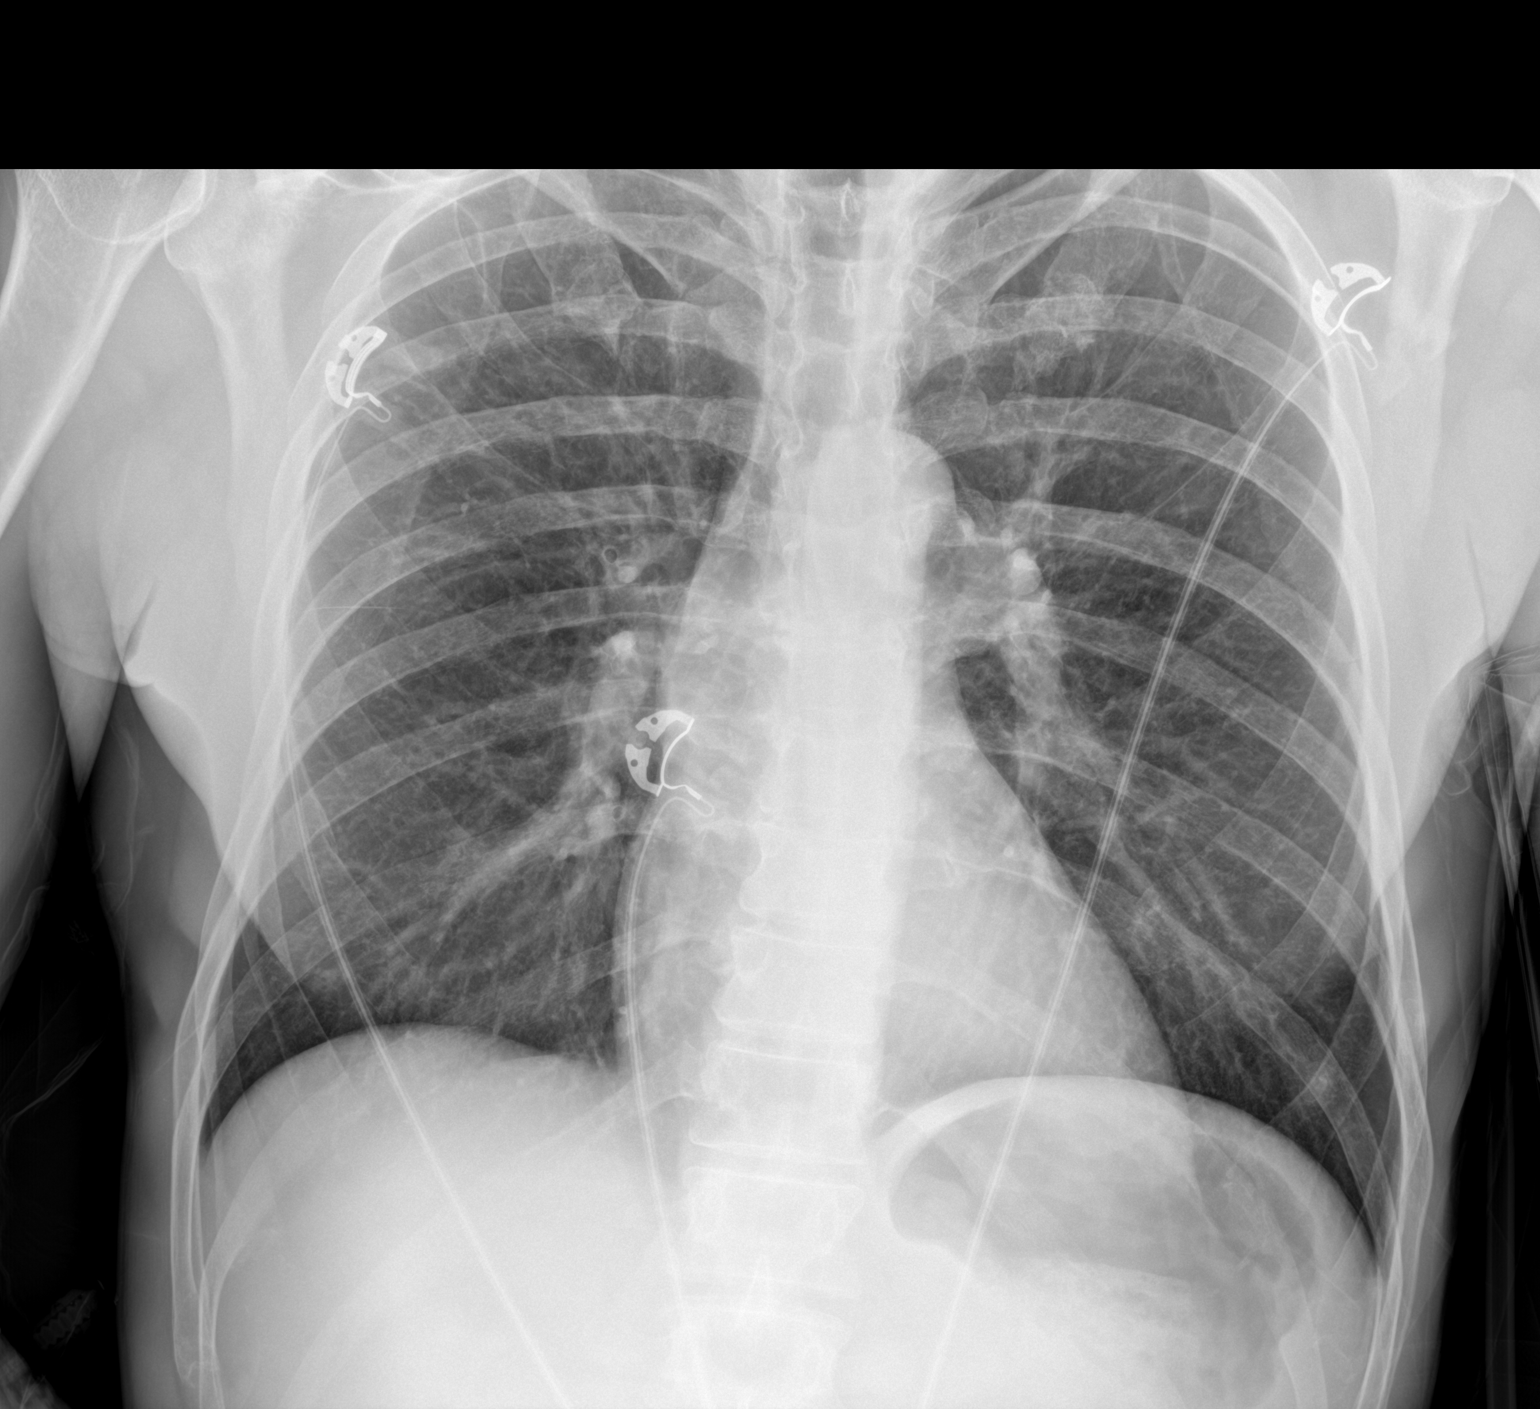

[1 of 1 positions shown; findings below may reference images not displayed]

FINDINGS: The heart size and mediastinal contours are within normal limits.
Both lungs are clear. The visualized skeletal structures are
unremarkable.
IMPRESSION: No active disease.

## 2023-02-05 DIAGNOSIS — F339 Major depressive disorder, recurrent, unspecified: Secondary | ICD-10-CM | POA: Diagnosis not present

## 2023-02-19 DIAGNOSIS — F339 Major depressive disorder, recurrent, unspecified: Secondary | ICD-10-CM | POA: Diagnosis not present

## 2023-03-05 DIAGNOSIS — F339 Major depressive disorder, recurrent, unspecified: Secondary | ICD-10-CM | POA: Diagnosis not present

## 2023-03-19 DIAGNOSIS — F339 Major depressive disorder, recurrent, unspecified: Secondary | ICD-10-CM | POA: Diagnosis not present

## 2023-04-02 DIAGNOSIS — F339 Major depressive disorder, recurrent, unspecified: Secondary | ICD-10-CM | POA: Diagnosis not present

## 2023-04-30 DIAGNOSIS — F339 Major depressive disorder, recurrent, unspecified: Secondary | ICD-10-CM | POA: Diagnosis not present

## 2023-05-29 ENCOUNTER — Other Ambulatory Visit: Payer: Self-pay

## 2023-05-29 ENCOUNTER — Emergency Department: Payer: 59

## 2023-05-29 DIAGNOSIS — S0081XA Abrasion of other part of head, initial encounter: Secondary | ICD-10-CM | POA: Diagnosis not present

## 2023-05-29 DIAGNOSIS — W228XXA Striking against or struck by other objects, initial encounter: Secondary | ICD-10-CM | POA: Diagnosis not present

## 2023-05-29 DIAGNOSIS — S060X9A Concussion with loss of consciousness of unspecified duration, initial encounter: Secondary | ICD-10-CM | POA: Diagnosis not present

## 2023-05-29 DIAGNOSIS — S0990XA Unspecified injury of head, initial encounter: Secondary | ICD-10-CM | POA: Diagnosis present

## 2023-05-29 NOTE — ED Triage Notes (Signed)
Pt presents via POV c/o using power tool to grind wood and a piece of wood being thrown by power tool into head. Abrasion noted to forehead. No complaints of eye injury. Pt c/o N/V prior to arrival.   A&O x4.

## 2023-05-30 ENCOUNTER — Emergency Department
Admission: EM | Admit: 2023-05-30 | Discharge: 2023-05-30 | Disposition: A | Discharge status: Home / Self Care | Payer: 59 | Attending: Emergency Medicine | Admitting: Emergency Medicine

## 2023-05-30 DIAGNOSIS — S060X9A Concussion with loss of consciousness of unspecified duration, initial encounter: Secondary | ICD-10-CM

## 2023-05-30 DIAGNOSIS — S0990XA Unspecified injury of head, initial encounter: Secondary | ICD-10-CM

## 2023-05-30 MED ORDER — ONDANSETRON 4 MG PO TBDP: 4.0000 mg | ORAL_TABLET | Freq: Three times a day (TID) | ORAL | 0 refills | Status: AC | PRN

## 2023-05-30 NOTE — ED Notes (Signed)
E-signature not working at this time. Pt visitor verbalized understanding of D/C instructions, prescriptions and follow up care with no further questions at this time. Pt in NAD and ambulatory at time of D/C.

## 2023-05-30 NOTE — ED Provider Notes (Signed)
[facility] Provider Note    Event Date/Time   First MD Initiated Contact with Patient 05/30/23 [phone]     (approximate)   History   Head Injury   HPI  Paul Contreras is a 43 y.o. male who has a history of depression who comes in with using a power tool to grind when a piece of wood was thrown off and hit him in the head.  He is an abrasion to the forehead.  No eye injury.  He denies any vision issues.  He did start to feel nauseous and did vomit afterwards felt like some palpitations.  States that he feels back to his normal self now  Physical Exam   Triage Vital Signs: ED Triage Vitals  Encounter Vitals Group     BP 05/29/23 2330 121/88     Systolic BP Percentile --      Diastolic BP Percentile --      Pulse Rate 05/29/23 2330 85     Resp 05/29/23 2330 18     Temp 05/29/23 2330 98.7 F (37.1 C)     Temp Source 05/29/23 2330 Oral     SpO2 05/29/23 2330 100 %     Weight --      Height --      Head Circumference --      Peak Flow --      Pain Score 05/29/23 2328 2     Pain Loc --      Pain Education --      Exclude from Growth Chart --     Most recent vital signs: Vitals:   05/29/23 2330  BP: 121/88  Pulse: 85  Resp: 18  Temp: 98.7 F (37.1 C)  SpO2: 100%     General: Awake, no distress.  CV:  Good peripheral perfusion.  Resp:  Normal effort.  Abd:  No distention.  Other:  Abrasion noted to the middle of the forehead with hematoma noted.  Extraocular movements are intact.  Pupils reactive bilaterally.  No C-spine tenderness.  Full range of motion of neck without any numbness or tingling in the arms or legs  ED Results / Procedures / Treatments   Labs (all labs ordered are listed, but only abnormal results are displayed) Labs Reviewed - No data to display   EKG  My interpretation of EKG:  Declined    RADIOLOGY I have reviewed the CT personally interpreted no evidence of intracranial  hemorrhage   PROCEDURES:  Critical Care performed: No  Procedures   MEDICATIONS ORDERED IN ED: Medications - No data to display   IMPRESSION / MDM / ASSESSMENT AND PLAN / ED COURSE  I reviewed the triage vital signs and the nursing notes.   Patient's presentation is most consistent with acute, uncomplicated illness.   Patient comes in with concerns for head injury no pain underneath the orbits to suggest orbital fracture.  Patient denies any symptoms to suggest foreign body of the eye.  Patient's Tdap was within the last 5 years.  Differential includes intracranial hemorrhage, concussion.  Given he did report some palpitations I did recommend EKG to evaluate for any arrhythmia, heart issues.  We also discussed doing any blood work to rule out any type of heart attack or anything else that could be more life-threatening.  His partner is at bedside and patient declined stating that he does not want to have any of this done.  He states that he just has some anxiety and  he feels like it was just related to his anxiety.  He is declining further workup at this time.  IMPRESSION: 1. No evidence of acute intracranial abnormality. 2. 'Right forehead contusion.  Given patient's symptoms suspect patient does have mild concussion.  Patient will be given Zofran, recommend Tylenol, ibuprofen and follow-up outpatient with PCP.  We discussed avoiding sports or reinjury.    FINAL CLINICAL IMPRESSION(S) / ED DIAGNOSES   Final diagnoses:  Injury of head, initial encounter  Concussion with loss of consciousness, initial encounter     Rx / DC Orders   ED Discharge Orders     None        Note:  This document was prepared using Dragon voice recognition software and may include unintentional dictation errors.   Concha Se, MD 05/30/23 [phone]

## 2023-05-30 NOTE — Discharge Instructions (Addendum)
Tylenol 1 g every 8 hours, ibuprofen 600 every 6-8 hours for one week with food to help with pain.  Take Zofran help with nausea.  Follow-up with your primary care doctor for clearance to return to sports and try to avoid reinjury.  IMPRESSION: 1. No evidence of acute intracranial abnormality. 2. 'Right forehead contusion.
